# Patient Record
Sex: Male | Born: 1960 | Race: White | Hispanic: No | Marital: Married | Smoking: Former smoker
Health system: Southern US, Community
[De-identification: ages and names within clinical notes are randomized; demographics above are authoritative.]

## PROBLEM LIST (undated history)

## (undated) DIAGNOSIS — Z98811 Dental restoration status: Secondary | ICD-10-CM

## (undated) DIAGNOSIS — F329 Major depressive disorder, single episode, unspecified: Secondary | ICD-10-CM

## (undated) DIAGNOSIS — R112 Nausea with vomiting, unspecified: Secondary | ICD-10-CM

## (undated) DIAGNOSIS — M87052 Idiopathic aseptic necrosis of left femur: Secondary | ICD-10-CM

## (undated) DIAGNOSIS — Z972 Presence of dental prosthetic device (complete) (partial): Secondary | ICD-10-CM

## (undated) DIAGNOSIS — K589 Irritable bowel syndrome without diarrhea: Secondary | ICD-10-CM

## (undated) DIAGNOSIS — G4700 Insomnia, unspecified: Secondary | ICD-10-CM

## (undated) DIAGNOSIS — F32A Depression, unspecified: Secondary | ICD-10-CM

## (undated) DIAGNOSIS — M1990 Unspecified osteoarthritis, unspecified site: Secondary | ICD-10-CM

## (undated) DIAGNOSIS — R911 Solitary pulmonary nodule: Secondary | ICD-10-CM

## (undated) DIAGNOSIS — K449 Diaphragmatic hernia without obstruction or gangrene: Secondary | ICD-10-CM

## (undated) DIAGNOSIS — I742 Embolism and thrombosis of arteries of the upper extremities: Secondary | ICD-10-CM

## (undated) DIAGNOSIS — S0990XA Unspecified injury of head, initial encounter: Secondary | ICD-10-CM

## (undated) DIAGNOSIS — G5621 Lesion of ulnar nerve, right upper limb: Secondary | ICD-10-CM

## (undated) DIAGNOSIS — K219 Gastro-esophageal reflux disease without esophagitis: Secondary | ICD-10-CM

## (undated) DIAGNOSIS — F419 Anxiety disorder, unspecified: Secondary | ICD-10-CM

## (undated) DIAGNOSIS — L723 Sebaceous cyst: Secondary | ICD-10-CM

## (undated) DIAGNOSIS — N281 Cyst of kidney, acquired: Secondary | ICD-10-CM

## (undated) DIAGNOSIS — S60511A Abrasion of right hand, initial encounter: Secondary | ICD-10-CM

## (undated) DIAGNOSIS — I2510 Atherosclerotic heart disease of native coronary artery without angina pectoris: Secondary | ICD-10-CM

## (undated) DIAGNOSIS — Z8489 Family history of other specified conditions: Secondary | ICD-10-CM

## (undated) DIAGNOSIS — Z973 Presence of spectacles and contact lenses: Secondary | ICD-10-CM

## (undated) DIAGNOSIS — D126 Benign neoplasm of colon, unspecified: Secondary | ICD-10-CM

## (undated) DIAGNOSIS — Z9889 Other specified postprocedural states: Secondary | ICD-10-CM

## (undated) DIAGNOSIS — E785 Hyperlipidemia, unspecified: Secondary | ICD-10-CM

## (undated) DIAGNOSIS — M459 Ankylosing spondylitis of unspecified sites in spine: Secondary | ICD-10-CM

## (undated) HISTORY — DX: Depression, unspecified: F32.A

## (undated) HISTORY — DX: Insomnia, unspecified: G47.00

## (undated) HISTORY — DX: Irritable bowel syndrome without diarrhea: K58.9

## (undated) HISTORY — DX: Diaphragmatic hernia without obstruction or gangrene: K44.9

## (undated) HISTORY — PX: UMBILICAL HERNIA REPAIR: SHX196

## (undated) HISTORY — DX: Atherosclerotic heart disease of native coronary artery without angina pectoris: I25.10

## (undated) HISTORY — DX: Benign neoplasm of colon, unspecified: D12.6

## (undated) HISTORY — DX: Solitary pulmonary nodule: R91.1

## (undated) HISTORY — PX: TONSILLECTOMY: SUR1361

## (undated) HISTORY — DX: Hyperlipidemia, unspecified: E78.5

## (undated) HISTORY — DX: Unspecified injury of head, initial encounter: S09.90XA

---
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
## 1999-08-10 ENCOUNTER — Emergency Department (HOSPITAL_COMMUNITY): Admission: EM | Admit: 1999-08-10 | Discharge: 1999-08-10 | Payer: Self-pay | Admitting: Emergency Medicine

## 2002-10-11 ENCOUNTER — Emergency Department (HOSPITAL_COMMUNITY): Admission: EM | Admit: 2002-10-11 | Discharge: 2002-10-11 | Payer: Self-pay | Admitting: Emergency Medicine

## 2002-10-11 ENCOUNTER — Encounter: Payer: Self-pay | Admitting: Emergency Medicine

## 2004-06-15 ENCOUNTER — Emergency Department (HOSPITAL_COMMUNITY): Admission: EM | Admit: 2004-06-15 | Discharge: 2004-06-15 | Payer: Self-pay | Admitting: Emergency Medicine

## 2004-06-15 IMAGING — CR DG CERVICAL SPINE COMPLETE 4+V
5 series · 5 of 5 positions shown · non-contrast
Comparison: none

CLINICAL DATA: Hit on top of head with heavy chain.  
 CERVICAL SPINE COMPLETE ? 5 VIEW ? [DATE] 
 There is no evidence of fracture or prevertebral soft tissue swelling.  Alignment is normal.  The intervertebral disk spaces are within normal limits.  No other significant bone abnormalities are identified.
 IMPRESSION
 Negative cervical spine radiographs.

[view not recorded (1 of 5)]
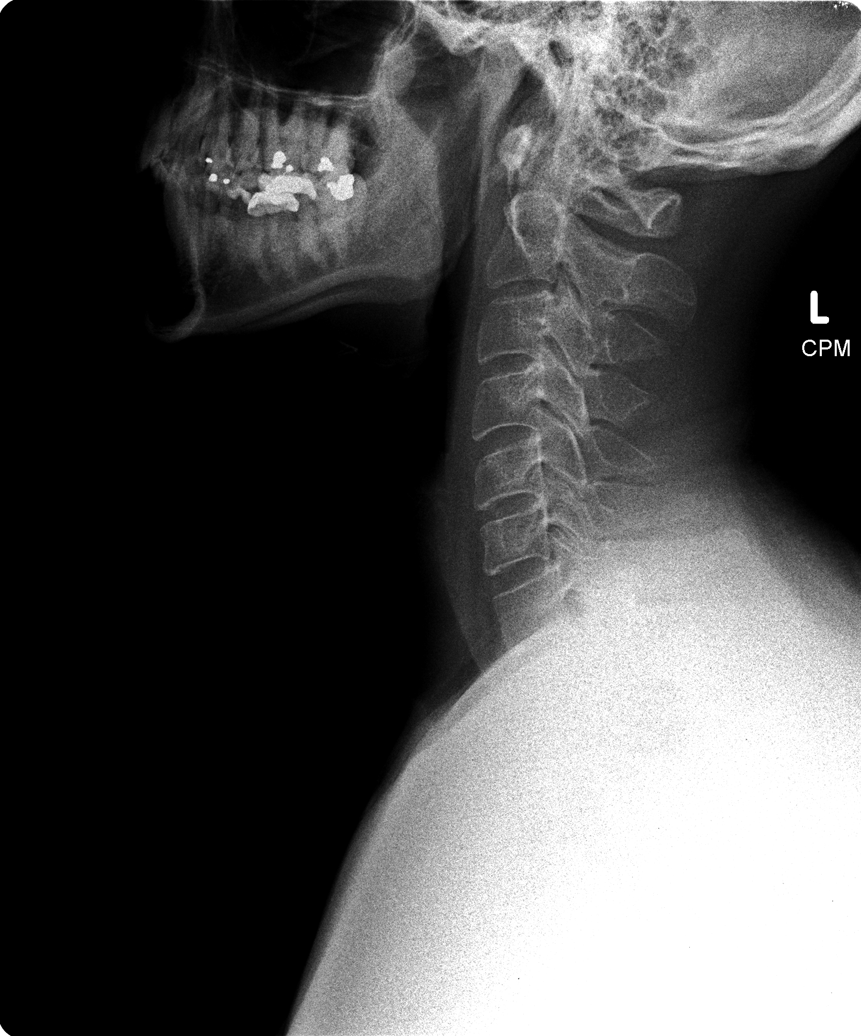

[view not recorded (2 of 5)]
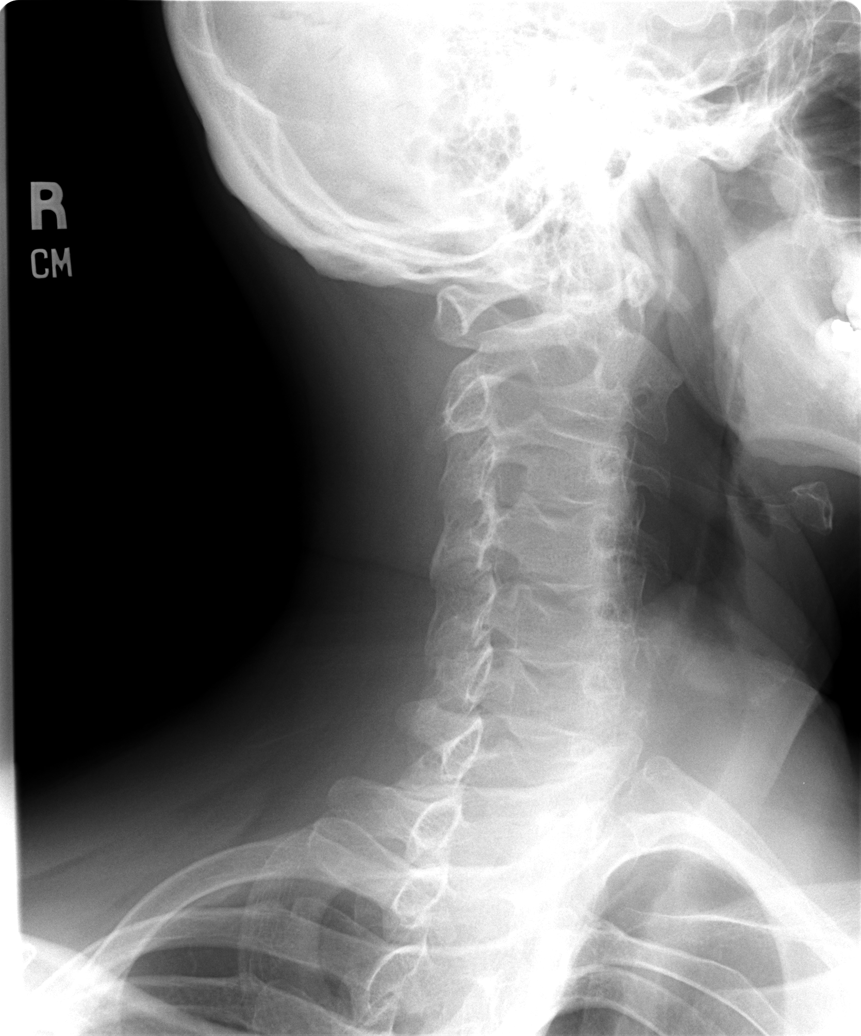

[view not recorded (3 of 5)]
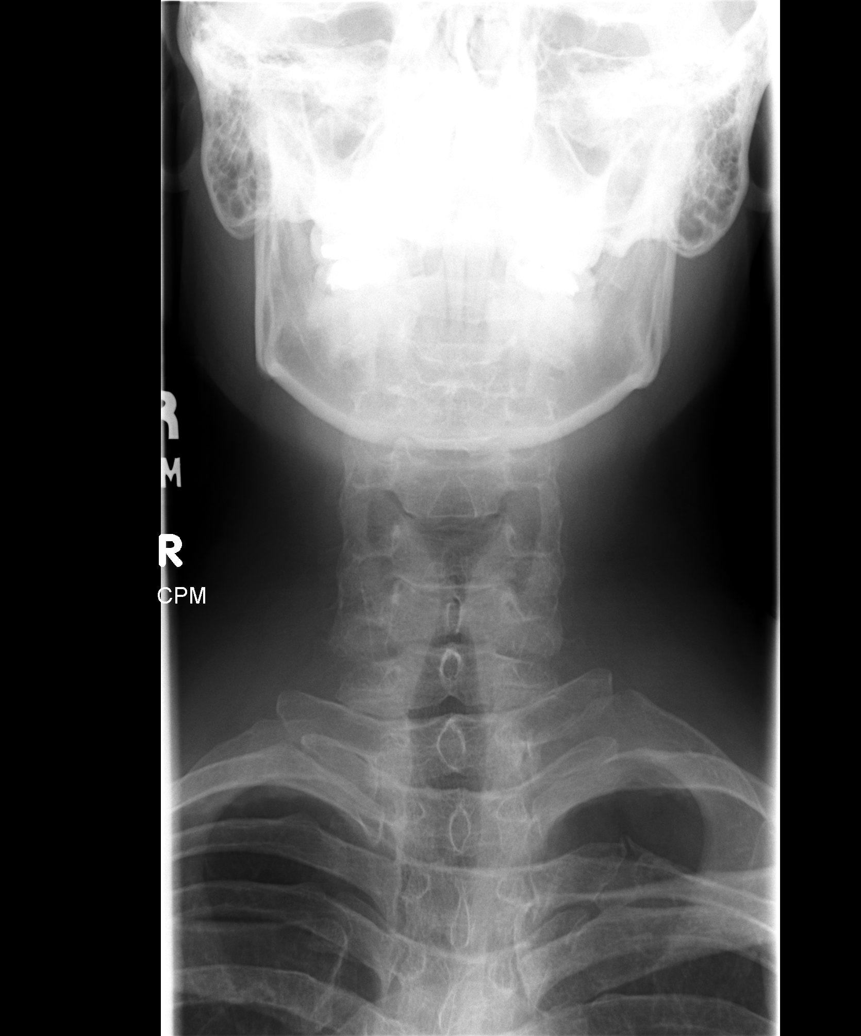

[view not recorded (4 of 5)]
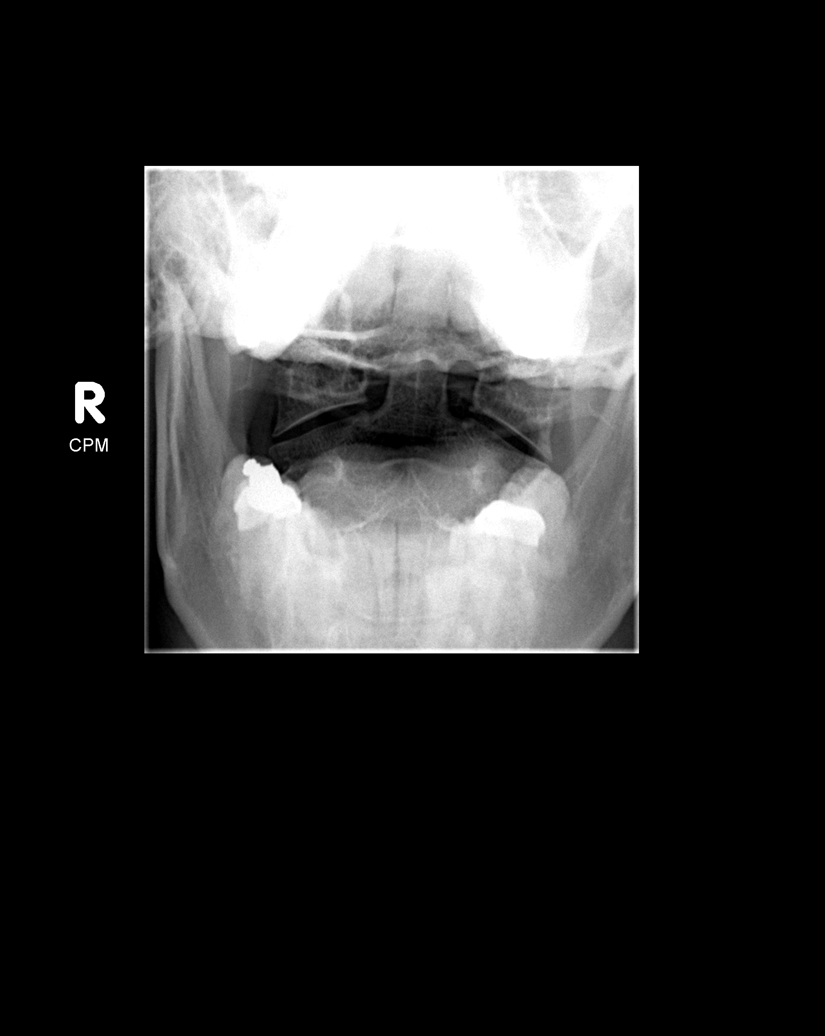

[view not recorded (5 of 5)]
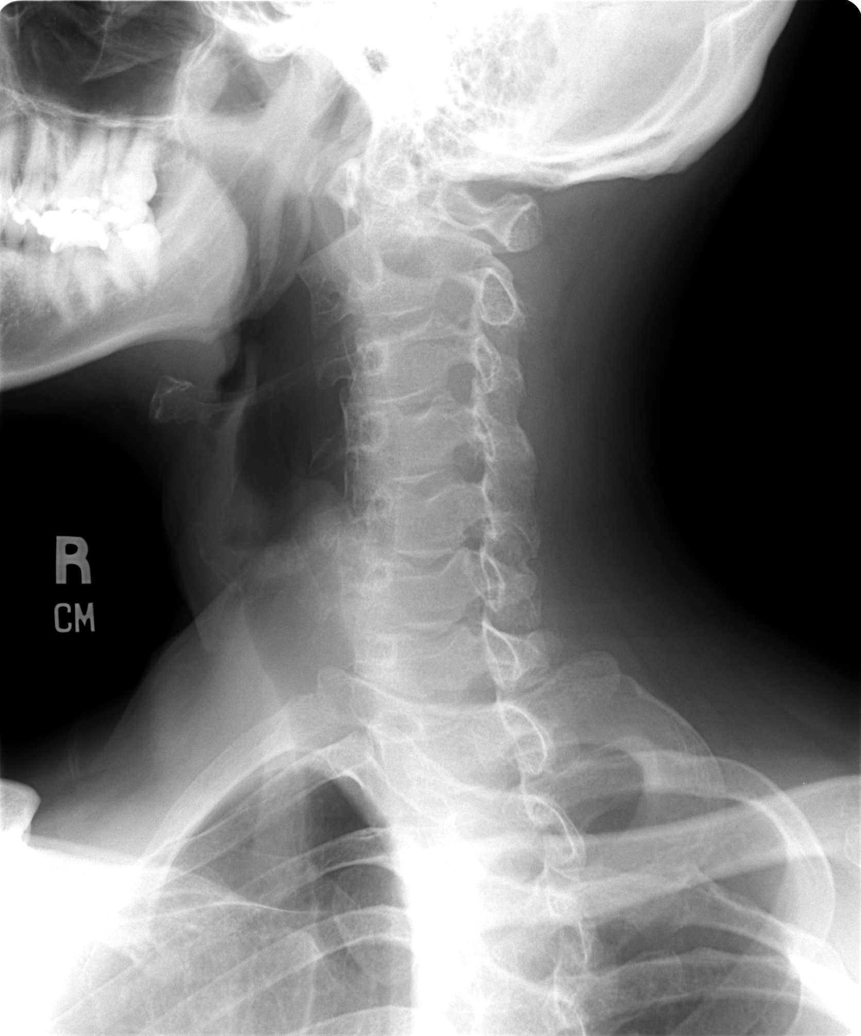

[5 of 5 positions shown; findings below may reference images not displayed]

## 2004-06-15 IMAGING — CT CT HEAD W/O CM
2 of 3 series · 14 of 33 positions shown, 18 images · non-contrast
Comparison: none

CLINICAL DATA: Chain fell on head with laceration on top of head.  
 CT HEAD WITHOUT CONTRAST
 Routine non-contrast head CT was performed. 

 There is no evidence of intracranial hemorrhage, brain edema, or mass effect. The ventricles are normal. No extra-axial abnormalities are identified. Bone windows show no significant abnormalities.
 IMPRESSION
 Negative non-contrast head CT.

[Series 2: — · sagittal · 0.67mm/px · 1 of 3 slices shown (1 of 2)]
[im 2/3  brain]
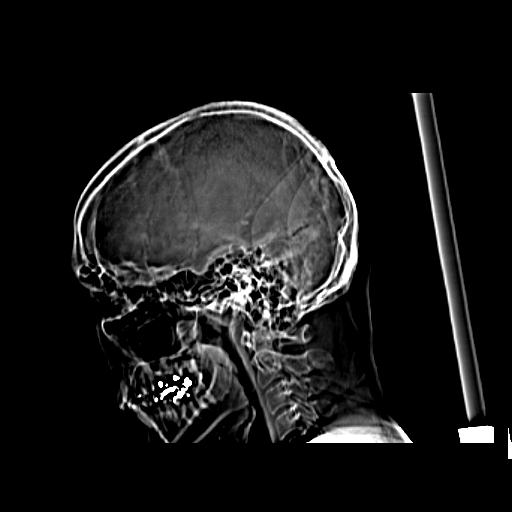

[Series 3: — · axial · 0.43mm/px · z∈[+963,+1083]mm · 13 of 30 slices shown, 17 images (2 of 2)]
[im 3/30  brain]
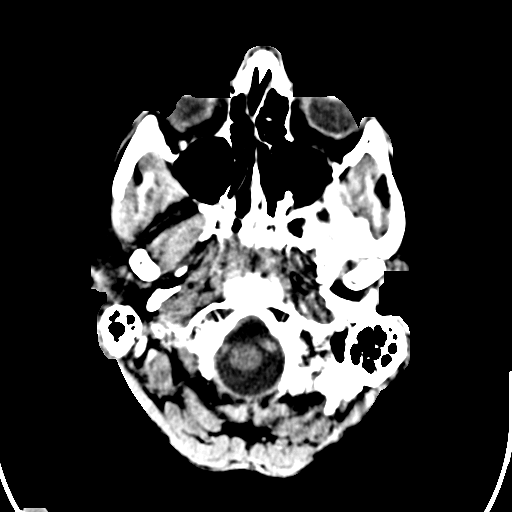
[im 3/30  bone]
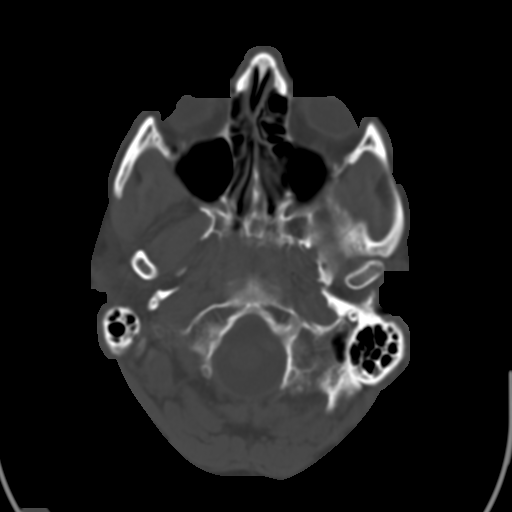
[im 5/30  brain]
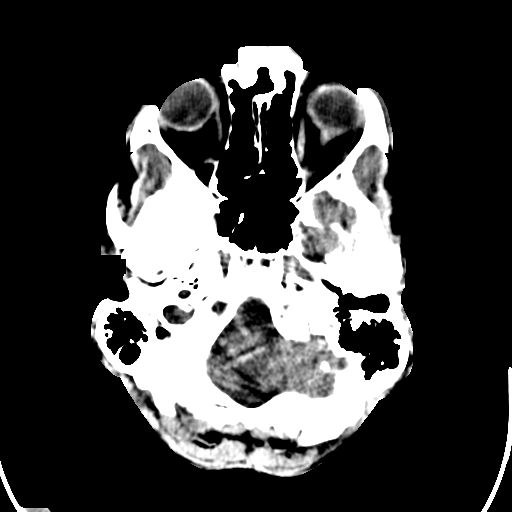
[im 7/30  brain]
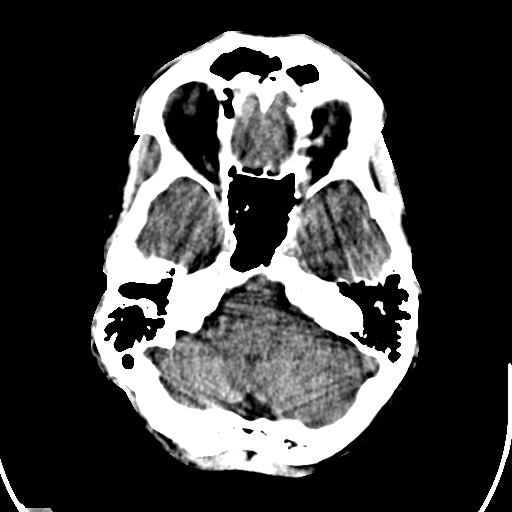
[im 9/30  brain]
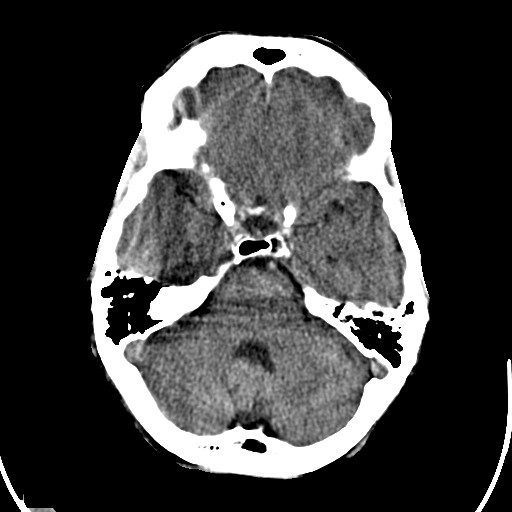
[im 11/30  brain]
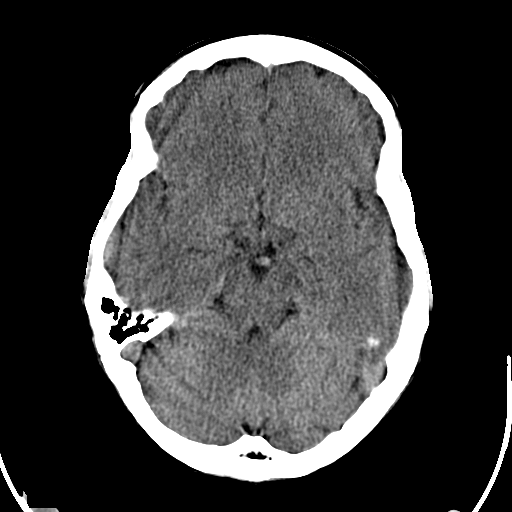
[im 11/30  bone]
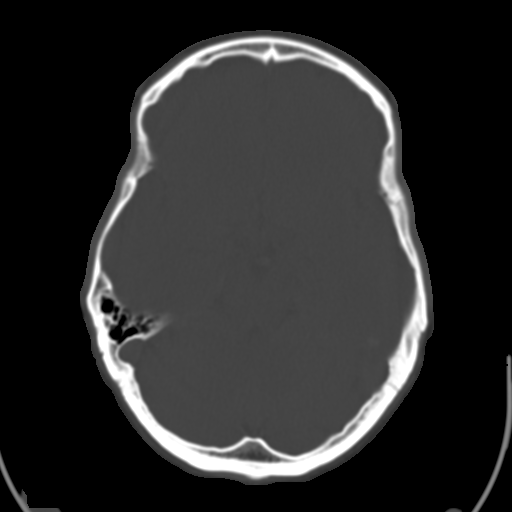
[im 13/30  brain]
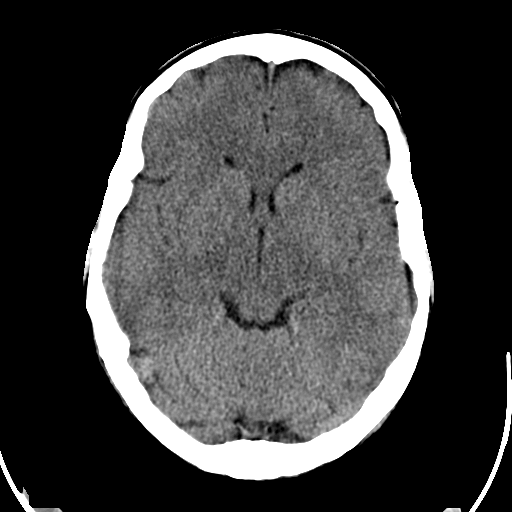
[im 15/30  brain]
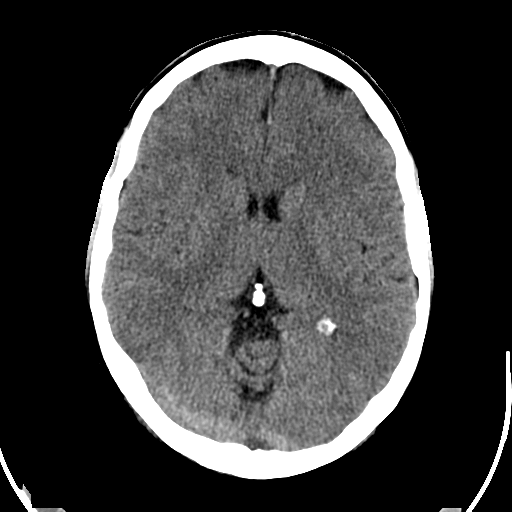
[im 17/30  brain]
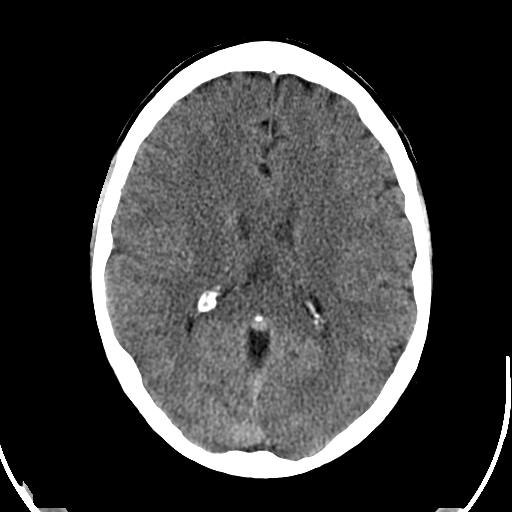
[im 19/30  brain]
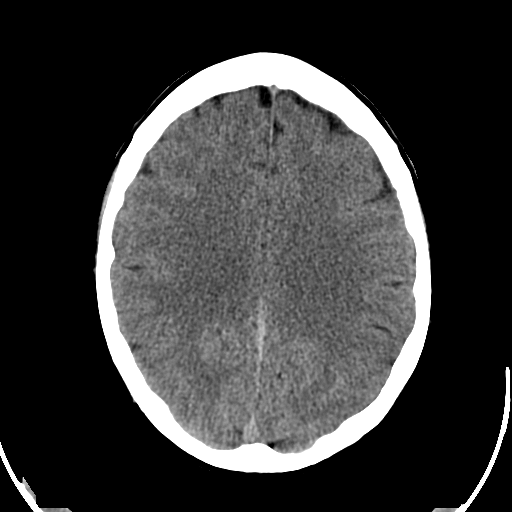
[im 19/30  bone]
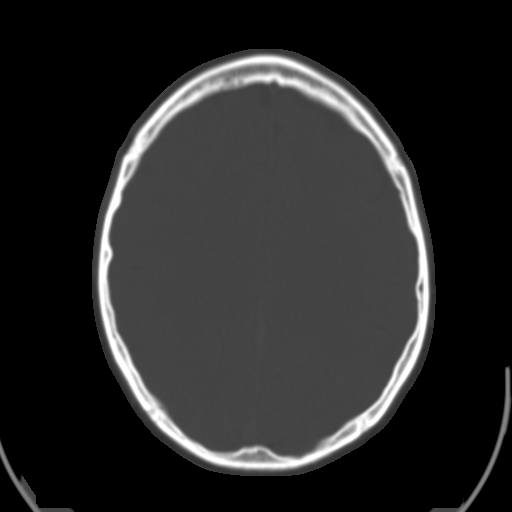
[im 21/30  brain]
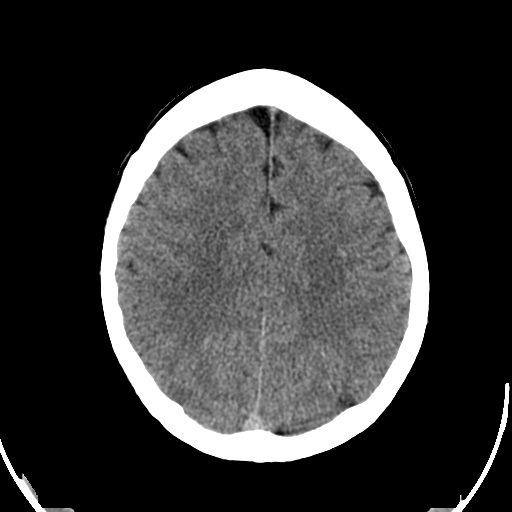
[im 23/30  brain]
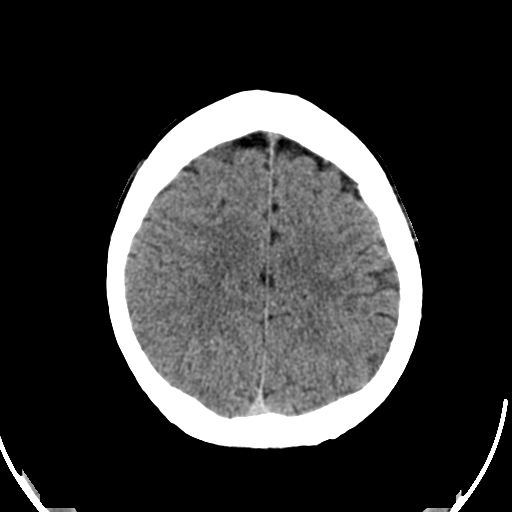
[im 25/30  brain]
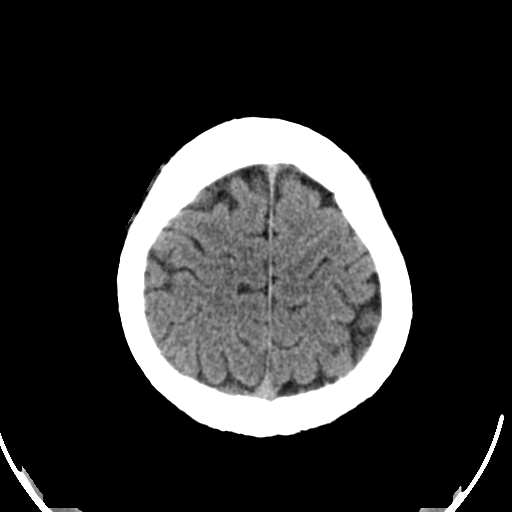
[im 27/30  brain]
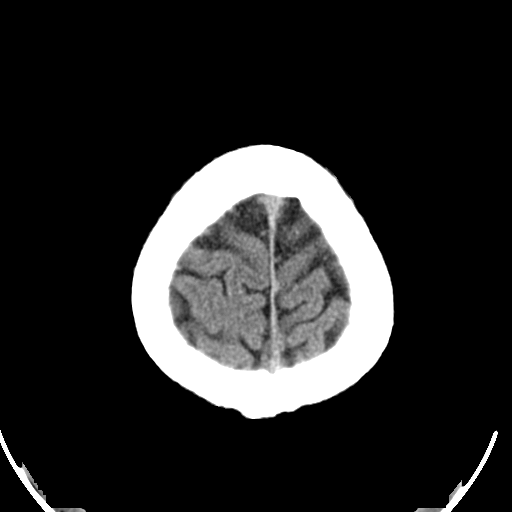
[im 27/30  bone]
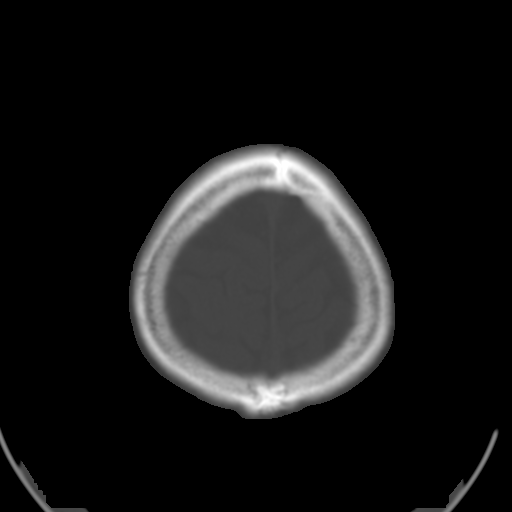

[14 of 33 positions shown; findings below may reference images not displayed]

## 2004-09-15 ENCOUNTER — Ambulatory Visit: Payer: Self-pay | Admitting: Gastroenterology

## 2004-10-09 ENCOUNTER — Ambulatory Visit: Payer: Self-pay | Admitting: Gastroenterology

## 2006-01-18 ENCOUNTER — Ambulatory Visit: Payer: Self-pay | Admitting: Gastroenterology

## 2007-12-28 ENCOUNTER — Ambulatory Visit (HOSPITAL_BASED_OUTPATIENT_CLINIC_OR_DEPARTMENT_OTHER): Admission: RE | Admit: 2007-12-28 | Discharge: 2007-12-28 | Payer: Self-pay | Admitting: Orthopedic Surgery

## 2007-12-28 HISTORY — PX: WRIST ARTHROSCOPY WITH ULNA SHORTENING: SHX5681

## 2010-11-01 DIAGNOSIS — S0990XA Unspecified injury of head, initial encounter: Secondary | ICD-10-CM

## 2010-11-01 HISTORY — DX: Unspecified injury of head, initial encounter: S09.90XA

## 2011-03-16 NOTE — Op Note (Signed)
NAME:  Daniel [name], Daniel [name] NO.:  [id number]   MEDICAL RECORD NO.:  [id number]          PATIENT TYPE:  AMB   LOCATION:  [facility]SC                          FACILITY:  [facility]   PHYSICIAN:  Katy Fitch. Sypher, M.D. 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   OPERATION:  1. 3 mm ulnar shortening osteoplasty with application of a ASIF      compression plate with lag screw across the ulnar shortening      osteoplasty.  2. Arthroscopic evaluation of right wrist followed by arthroscopic      debridement of bridging scar between dorsal capsule and torn      lunatotriquetral interosseous ligament and debridement of      degenerative triangular fibrocartilage complex tear and lunate      chondromalacia.   OPERATING SURGEON:  Josephine Igo, M.D.   ASSISTANT:  Annye Rusk, PA-C.   ANESTHESIA:  General by LMA.  Supervising anesthesiologist is Dr. Diamantina Monks.   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  He is  referred by his primary  care physician, Creola Corn, for evaluation and  management of a painful right wrist.  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  Clinical examination  revealed a prominent right distal ulna and clinical signs of ulnocarpal  abutment.  Plain x-rays documented 2 mm ulnar variance and an MRI of the  wrist documented lunate chondromalacia, edema in the marrow and LT  ligament injury and signs of chronic ulnocarpal abutment.   We advised Daniel [name] to strongly consider proceeding with an ulnar  shortening followed by arthroscopic debridement of the wrist.   Preoperatively he was advised that we could not replace any his  cartilage surfaces, however, with decompression of the ulnocarpal joint,  typically individuals will have significant relief of their pain.  He  understands with an ulnar shortening there is always a small chance of  nonunion and/or malunion.  There is a chance that he will require a  secondary procedure if we have healing problems of the ulna.   With compression plate systems and lag screw fixation in general, we  have had success with healing in 8-12 weeks.  Preoperatively, questions were invited and answered in detail.   PROCEDURE:  Daniel [name] is brought to the operating room and placed  in supine position on the operating table.   Following an anesthesia consult with Dr. Katrinka Blazing, general anesthesia by  LMA technique was recommended and accepted.   Daniel [name] was given 1 gram of Ancef as IV prophylactic antibiotic.  He is brought to room 8 and  placed in supine position on the operating  table and under Dr. Michaelle Copas direct supervision, general anesthesia by  LMA technique induced.   His right arm was prepped with Betadine soap solution, sterilely draped.  A pneumatic tourniquet was applied to the proximal right brachium.   Following exsanguination of the right arm with Esmarch bandage, arterial  tourniquet inflated to  220 mmHg.  Procedure commenced with a 12 cm  incision directly over the dorsal ulna.  Subcutaneous tissues were  carefully divided taking care to release the fascia overlying the  extensor carpi ulnaris and elevating the extensor indicis proprius.   A proper position on the ulna was selected and a six-hole plate  contoured to fit the ulna.   The plate was placed with two distal cortical screws followed by  creation of a 50 degrees oblique ulnar shortening osteoplasty precisely  measuring 3 mm of removed bone.   The osteoplasty was created with two parallel cuts with a oscillating  saw followed by resection of the bone fragment and curettage of the  margins to create better surfaces for healing of the cortical bone.   The plate was applied with standard ASIF technique utilizing a  compression clamp.   The shortening of the ulna was checked with C-arm fluoroscope and found  to result in an ulnar neutral position.   The osteoplasty was completed by placement of a 20-mm compression lag  screw across the osteotomy at a 90 degrees angle and placing the  remaining screws in the plate proximally and distally.   Final images were obtained with a C-arm fluoroscope documenting very  satisfactory construct.   The wound was then irrigated followed by placement of bone graft around  the osteoplasty site from the drill holes.  The wound was repaired with  subdermal suture of 4-0 Vicryl and intradermal 3-0 Prolene with Steri-  Strips.   The wrist was then distracted with 10 pounds traction in a tower  designed for wrist arthroscopy.  Counter traction was applied to the  proximal forearm.   The scope was gently placed through the 3-4 dorsal portal and diagnostic  arthroscopy revealed intact hyaline intra-articular cartilage surfaces  on the scaphoid and radial aspect of the lunate.  The ulnar aspect of  the lunate was obliterated with scar forming from the dorsal capsule.  A  4-5 dorsal  portal was created with a 18 gauge needle followed by use of  a blunt probe to gently release scar.  The suction shaver was placed and  the scar between the ulnar aspect of lunate and LT ligament was debrided  with suction shaver.  Care was taken to protect the triangular  fibrocartilage.   After the scar had been removed, we could visualize ulnocarpal joint.  The trial fibrocartilage had a central degenerative tear.  The scope was  then removed from 3-4 dorsal portal placed in the 4-5 portal.  The  triangular fibrocartilage tear was documented with a digital camera as  was the LT ligament which had a grade 3 tear.  After thorough  debridement,  the scope was removed.  There no apparent complications.   The portals were repaired with Steri-Strips.   Daniel [name] was placed in compressive dressing with a long-arm splint  maintaining the forearm in neutral position and the wrist in neutral  position.  There no apparent complications.   He is awakened general anesthesia and transferred over stable signs.      Katy Fitch Sypher, M.D.  Electronically Signed     RVS/MEDQ  D:  12/28/2007  T:  12/29/2007  Job:  [id number]   cc:   Gwen Pounds, MD

## 2011-06-30 ENCOUNTER — Encounter (HOSPITAL_BASED_OUTPATIENT_CLINIC_OR_DEPARTMENT_OTHER)
Admission: RE | Admit: 2011-06-30 | Discharge: 2011-06-30 | Disposition: A | Discharge status: Home / Self Care | Payer: 59 | Source: Ambulatory Visit | Attending: Orthopedic Surgery | Admitting: Orthopedic Surgery

## 2011-07-01 ENCOUNTER — Ambulatory Visit (HOSPITAL_BASED_OUTPATIENT_CLINIC_OR_DEPARTMENT_OTHER)
Admission: RE | Admit: 2011-07-01 | Discharge: 2011-07-01 | Disposition: A | Discharge status: Home / Self Care | Payer: 59 | Source: Ambulatory Visit | Attending: Orthopedic Surgery | Admitting: Orthopedic Surgery

## 2011-07-01 DIAGNOSIS — M25519 Pain in unspecified shoulder: Secondary | ICD-10-CM | POA: Insufficient documentation

## 2011-07-01 DIAGNOSIS — M6688 Spontaneous rupture of other tendons, other: Secondary | ICD-10-CM | POA: Insufficient documentation

## 2011-07-01 DIAGNOSIS — Z01810 Encounter for preprocedural cardiovascular examination: Secondary | ICD-10-CM | POA: Insufficient documentation

## 2011-07-01 DIAGNOSIS — Z01812 Encounter for preprocedural laboratory examination: Secondary | ICD-10-CM | POA: Insufficient documentation

## 2011-07-01 HISTORY — PX: ELBOW ARTHROSCOPY WITH TENDON RECONSTRUCTION: SHX5616

## 2011-07-01 LAB — POCT HEMOGLOBIN-HEMACUE: Hemoglobin: 15.8 g/dL (ref 13.0–17.0)

## 2011-07-06 NOTE — Op Note (Signed)
NAME:  Daniel [name], Daniel [name] NO.:  [id number]  MEDICAL RECORD NO.:  [id number]  LOCATION:                                 FACILITY:  PHYSICIAN:  Katy Fitch. Janmarie Smoot, M.D.      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  OPERATION:  Reconstruction extensor carpi radialis longus and brevis tendons origins to left lateral condyle with decortication and drilling of epicondyle prior to repair.  OPERATING SURGEON:  Katy Fitch. Ruthann Angulo, MD  ASSISTANT:  Marveen Reeks Dasnoit, PA-C  ANESTHESIA:  General by LMA.  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  He sustained a number of injuries on the job and at home over the years and has strained his elbow in home maintenance while back.  He has had left lateral elbow pain for more than 6 months.  We have tried all the nonoperative measures without success.  After more than 4 months of pain, we sent him for an MRI of his elbow which demonstrated in June 2012 cavitary tendinopathy with rupture of the extensor carpi radialis brevis origin from the left lateral humeral epicondyle.  We advised Daniel [name] when he was able to take time off from work to proceed with reconstruction.  He is familiar with the method of tendon origin reconstruction.  Questions were invited and answered in detail.  Preoperatively, he was  interviewed by Dr. Sampson Goon who recommended proceeding with anesthesia by general/LMA technique after informed consent was accepted by Daniel [name].  PROCEDURE:  Daniel [name] was brought to room 2 at the [facility] and placed in supine position upon the operating table. Following the induction of general anesthesia by LMA technique under Dr. Jarrett Ables direct supervision, his left arm was prepped with Betadine soap and solution and sterilely draped.  A pneumatic tourniquet was applied to the proximal left brachium.  Following a routine surgical time-out, left arm was exsanguinated with Esmarch bandage and the arterial tourniquet was inflated to 220 mmHg.  The incision was placed posterior to the epicondyle and along the extensor carpi radialis longus and anconeus margin.  Subcutaneous tissues were carefully divided taking care to identify the fascia and release as a separate layer.  There were neovascularization vessels directly over the extensor carpi radialis longus.  These were electrocauterized followed by incision and elevation of the extensor carpi radialis longus off the epicondyle.  The brevis had a dime sized area of necrotic tendon that had released from the epicondyle.  There was a reactive new bone formation with a substantial osteophyte.  The osteophyte was removed with rongeur dissection followed by drilling of the epicondyle with a 0.035-inch Kirschner wire,  approximately 25 small holes to the intramedullary canal to improve vascularity on the surface.  After irrigation, the extensor carpi radialis longus and brevis were gathered with mattress sutures of 3-0 FiberWire passed through bone tunnels and repaired anatomically to the decorticated and drilled epicondyle.  The tendon margin was then inset with a number of mattress sutures of 3- 0 FiberWire and 3-0 Vicryl.  The portion of the anconeus muscle was transferred anteriorly to improve vascularity of  the tendon.  After irrigation, the fascia was repaired as a separate layer with a series of figure-of-eight sutures of 3-0 Vicryl with knots buried, followed by repair of the skin with subcutaneous 3-0 Vicryl and intradermal 3-0 Prolene.  Daniel [name] was placed in a volar wrist splint maintaining the wrist in 40 degrees of dorsiflexion.  His elbow was dressed with sterile gauze and Tegaderm.  Lidocaine 2% was infiltrated for postoperative analgesia. Daniel [name] was provided 1 g of Ancef as an IV prophylactic antibiotic preoperatively without consequence.  He was awakened from general anesthesia and transferred to the recovery room with stable vital signs.  We anticipate discharge with prescriptions for Percocet 5 mg one p.o. q.4-6 hours p.r.n. pain 30 tablets without refill, also Keflex 500 mg one p.o. q.12 hours x4 days as a prophylactic antibiotic.  He is encouraged to use naproxen sodium as an adjunctive analgesic at home.     Katy Fitch Orvell Careaga, M.D.     RVS/MEDQ  D:  07/01/2011  T:  07/01/2011  Job:  [id number]  Electronically Signed by Daniel [name] M.D. on 07/06/2011 09:54:35 AM

## 2011-07-23 LAB — POCT HEMOGLOBIN-HEMACUE: Hemoglobin: 15.6

## 2012-09-02 ENCOUNTER — Encounter (HOSPITAL_BASED_OUTPATIENT_CLINIC_OR_DEPARTMENT_OTHER): Payer: Self-pay | Admitting: *Deleted

## 2012-09-02 ENCOUNTER — Emergency Department (HOSPITAL_BASED_OUTPATIENT_CLINIC_OR_DEPARTMENT_OTHER)
Admission: EM | Admit: 2012-09-02 | Discharge: 2012-09-02 | Disposition: A | Discharge status: Home / Self Care | Payer: 59 | Attending: Emergency Medicine | Admitting: Emergency Medicine

## 2012-09-02 DIAGNOSIS — Z79899 Other long term (current) drug therapy: Secondary | ICD-10-CM | POA: Insufficient documentation

## 2012-09-02 DIAGNOSIS — T1500XA Foreign body in cornea, unspecified eye, initial encounter: Secondary | ICD-10-CM

## 2012-09-02 DIAGNOSIS — Y990 Civilian activity done for income or pay: Secondary | ICD-10-CM | POA: Insufficient documentation

## 2012-09-02 DIAGNOSIS — Y9269 Other specified industrial and construction area as the place of occurrence of the external cause: Secondary | ICD-10-CM | POA: Insufficient documentation

## 2012-09-02 DIAGNOSIS — T1590XA Foreign body on external eye, part unspecified, unspecified eye, initial encounter: Secondary | ICD-10-CM | POA: Insufficient documentation

## 2012-09-02 DIAGNOSIS — Y9389 Activity, other specified: Secondary | ICD-10-CM | POA: Insufficient documentation

## 2012-09-02 MED ORDER — PROPARACAINE HCL 0.5 % OP SOLN
OPHTHALMIC | Status: AC
  Filled 2012-09-02: qty 15

## 2012-09-02 MED ORDER — FLUORESCEIN SODIUM 1 MG OP STRP
1.0000 | ORAL_STRIP | Freq: Once | OPHTHALMIC | Status: DC
  Filled 2012-09-02: qty 1

## 2012-09-02 MED ORDER — BACITRACIN-POLYMYXIN B 500-10000 UNIT/GM OP OINT: TOPICAL_OINTMENT | Freq: Two times a day (BID) | OPHTHALMIC | Status: DC

## 2012-09-02 MED ORDER — PROPARACAINE HCL 0.5 % OP SOLN
2.0000 [drp] | Freq: Once | OPHTHALMIC | Status: AC
  Administered 2012-09-02: 2 [drp] via OPHTHALMIC

## 2012-09-02 MED ORDER — TETRACAINE HCL 0.5 % OP SOLN
2.0000 [drp] | Freq: Once | OPHTHALMIC | Status: AC
  Administered 2012-09-02: 2 [drp] via OPHTHALMIC
  Filled 2012-09-02: qty 2

## 2012-09-02 NOTE — ED Provider Notes (Signed)
History  This chart was scribed for Daniel Skene, MD by Shari Heritage and Marlin Canary. The patient was seen in room MHT14/MHT14. Patient's care was started at 2101.  CSN: [id number]  Arrival date & time 09/02/12  1846   First MD Initiated Contact with Patient 09/02/12 2101      Chief Complaint  Patient presents with  . Foreign Body in Eye     The history is provided by the patient. No language interpreter was used.    Daniel [name] is a 51 y.o. male who presents to the Emergency Department complaining of constant, moderate right eye pain onset 1 day ago. He says he was grinding metal while wearing goggles at work 2 days ago when something got into his eye. Patient states that he began to feel eye irritation the next day and he describes the pain as feeling like his eye was being cut. He denies  HA, SOB, emesis or any other symptoms. Patient states his vision is slightly blurry but says his vision is getting progressively worse due to old age. He reports no other significant past medical history.  Patient's ophthalmologist practices at [facility], but patient is unsure of his name.   History reviewed. No pertinent past medical history.  Past Surgical History  Procedure Date  . Fracture surgery     History reviewed. No pertinent family history.  History  Substance Use Topics  . Smoking status: Never Smoker   . Smokeless tobacco: Not on file  . Alcohol Use: No      Review of Systems At least 10pt or greater review of systems completed and are negative except where specified in the HPI.  Allergies  Vicodin  Home Medications   Current Outpatient Rx  Name Route Sig Dispense Refill  . CITALOPRAM HYDROBROMIDE 40 MG PO TABS Oral Take 40 mg by mouth daily.      BP 141/99  Pulse 67  Temp 98.3 F (36.8 C) (Oral)  Resp 16  SpO2 100%  Physical Exam  Nursing notes reviewed.  Electronic medical record reviewed. VITAL SIGNS:   Filed Vitals:   09/02/12  1905  BP: 141/99  Pulse: 67  Temp: 98.3 F (36.8 C)  TempSrc: Oral  Resp: 16  SpO2: 100%   CONSTITUTIONAL: Awake, oriented, appears non-toxic HENT: Atraumatic, normocephalic, oral mucosa pink and moist, airway patent. Nares patent without drainage. External ears normal. EYES: Ocular Exam General appearance:right eye injected conjunctiva, iris and pupil appear intact, left eye normal Visual acuity OD:20/20, OS:20/20, OU:20/20 No visual field deficits, EOMI, PERRLA 4mm Fundoscopic exam with pan-opthalmoscope showed no papilledema, normal vasculature Lids/Lashes:normal Anterior Chamber: No cell/flare, no hyphema, no hypopyon. No pain to iris contraction. Iris appears normal. Corneal FB 4'o'clock position of the cornea in the right eye.  Appears to be metallic with no rust ring Fluoroscein exam with cobalt blue filter reveals dye uptake at the FB in the 4'o'clock position of the cornea NECK: Trachea midline, non-tender, supple CARDIOVASCULAR: Normal heart rate, Normal rhythm, No murmurs, rubs, gallops PULMONARY/CHEST: Clear to auscultation, no rhonchi, wheezes, or rales. Symmetrical breath sounds. Non-tender. NEUROLOGIC: Non-focal, moving all four extremities, no gross sensory or motor deficits. EXTREMITIES: No clubbing, cyanosis, or edema SKIN: Warm, Dry, No erythema, No rash  ED Course  Procedures (including critical care time)  Removal of RIGHT sided Corneal FB Eye was anesthetized using 2 drops proparacaine.  Using an insulin needle, FB was mostly removed with needle and eye flushed afterward. Pt tolerated the  procedure well.   COORDINATION OF CARE: 9:30pm- Patient informed of current plan for treatment and evaluation and agrees with plan at this time.   Labs Reviewed - No data to display  No results found.   1. Corneal foreign body with residual material     Medications  citalopram (CELEXA) 40 MG tablet (not administered)  bacitracin-polymyxin b (POLYSPORIN) ophthalmic  ointment (not administered)  tetracaine (PONTOCAINE) 0.5 % ophthalmic solution 2 drop (2 drop Right Eye Given 09/02/12 2026)  proparacaine (ALCAINE) 0.5 % ophthalmic solution 2 drop (2 drop Right Eye Given 09/02/12 2130)      MDM  Daniel [name] is a 51 y.o. male presenting with corneal FB, likely metal.  PT encounter began with PA, however, pt became verbally abusive with her and she deferred his care to me. I removed most of FB with insulin needle and slit lamp - patient had considerable difficulty keeping his eye still - based on risks of possible damaging the cornea further - pt agrees to follow up tomorrow with ophthalmology for complete removal of FB and surrounding cornea which will likely develop a rust ring. Vision unaffected at this time - no anterior chamber pathology likely. Pt does not wear contacts. Discussed case with Dr. Maple Hudson - pt to follow up tomorrow at 0800. Antibiotic ointment Rx.  I explained the diagnosis and have given explicit precautions to return to the ER including changes in vision or any other new or worsening symptoms. The patient understands and accepts the medical plan as it's been dictated and I have answered their questions. Discharge instructions concerning home care and prescriptions have been given.  The patient is STABLE and is discharged to home in good condition.    I personally performed the services described in this documentation, which was scribed in my presence. The recorded information has been reviewed and considered. Daniel [name], M.D.     Daniel Skene, MD 09/04/12 1332

## 2012-09-02 NOTE — ED Notes (Signed)
Pt's eye was flushed with approx 300 ml NS. Tolerated poorly. Small ?fleck of metal noted in emesis basin. Dr. Jerilynn Birkenhead advised.

## 2012-09-02 NOTE — ED Notes (Signed)
Pt presents to ED today with piece of metal in right eye.  Pt states was wearing safety glasses but can see and feel peice

## 2014-08-01 DIAGNOSIS — G5621 Lesion of ulnar nerve, right upper limb: Secondary | ICD-10-CM

## 2014-08-01 DIAGNOSIS — I742 Embolism and thrombosis of arteries of the upper extremities: Secondary | ICD-10-CM

## 2014-08-01 HISTORY — DX: Embolism and thrombosis of arteries of the upper extremities: I74.2

## 2014-08-01 HISTORY — DX: Lesion of ulnar nerve, right upper limb: G56.21

## 2014-08-07 ENCOUNTER — Other Ambulatory Visit (HOSPITAL_COMMUNITY): Payer: Self-pay | Admitting: Orthopedic Surgery

## 2014-08-07 DIAGNOSIS — I742 Embolism and thrombosis of arteries of the upper extremities: Secondary | ICD-10-CM

## 2014-08-09 ENCOUNTER — Encounter (HOSPITAL_COMMUNITY): Payer: Self-pay | Admitting: Pharmacy Technician

## 2014-08-12 ENCOUNTER — Other Ambulatory Visit: Payer: Self-pay | Admitting: Radiology

## 2014-08-13 ENCOUNTER — Ambulatory Visit (HOSPITAL_COMMUNITY)
Admission: RE | Admit: 2014-08-13 | Discharge: 2014-08-13 | Disposition: A | Discharge status: Home / Self Care | Payer: 59 | Source: Ambulatory Visit | Attending: Orthopedic Surgery | Admitting: Orthopedic Surgery

## 2014-08-13 ENCOUNTER — Other Ambulatory Visit (HOSPITAL_COMMUNITY): Payer: Self-pay | Admitting: Orthopedic Surgery

## 2014-08-13 ENCOUNTER — Encounter (HOSPITAL_COMMUNITY): Payer: Self-pay

## 2014-08-13 DIAGNOSIS — I742 Embolism and thrombosis of arteries of the upper extremities: Secondary | ICD-10-CM | POA: Diagnosis present

## 2014-08-13 HISTORY — DX: Anxiety disorder, unspecified: F41.9

## 2014-08-13 HISTORY — DX: Gastro-esophageal reflux disease without esophagitis: K21.9

## 2014-08-13 HISTORY — DX: Unspecified osteoarthritis, unspecified site: M19.90

## 2014-08-13 LAB — BASIC METABOLIC PANEL
ANION GAP: 14 (ref 5–15)
BUN: 13 mg/dL (ref 6–23)
CALCIUM: 9.1 mg/dL (ref 8.4–10.5)
CHLORIDE: 101 meq/L (ref 96–112)
CO2: 24 meq/L (ref 19–32)
CREATININE: 0.97 mg/dL (ref 0.50–1.35)
GFR calc Af Amer: >90 mL/min (ref >90)
GFR calc non Af Amer: >90 mL/min (ref >90)
GLUCOSE: 93 mg/dL (ref 70–99)
Potassium: 3.8 mEq/L (ref 3.7–5.3)
Sodium: 139 mEq/L (ref 137–147)

## 2014-08-13 LAB — PROTIME-INR
INR: 0.97 (ref 0.00–1.49)
Prothrombin Time: 13 seconds (ref 11.6–15.2)

## 2014-08-13 LAB — CBC WITH DIFFERENTIAL/PLATELET
BASOS PCT: 0 % (ref 0–1)
Basophils Absolute: 0 10*3/uL (ref 0.0–0.1)
EOS PCT: 1 % (ref 0–5)
Eosinophils Absolute: 0 10*3/uL (ref 0.0–0.7)
HEMATOCRIT: 42.3 % (ref 39.0–52.0)
HEMOGLOBIN: 15.2 g/dL (ref 13.0–17.0)
Lymphocytes Relative: 43 % (ref 12–46)
Lymphs Abs: 2.6 10*3/uL (ref 0.7–4.0)
MCH: 32.1 pg (ref 26.0–34.0)
MCHC: 35.9 g/dL (ref 30.0–36.0)
MCV: 89.4 fL (ref 78.0–100.0)
MONO ABS: 0.5 10*3/uL (ref 0.1–1.0)
MONOS PCT: 9 % (ref 3–12)
NEUTROS ABS: 2.8 10*3/uL (ref 1.7–7.7)
Neutrophils Relative %: 47 % (ref 43–77)
Platelets: 220 10*3/uL (ref 150–400)
RBC: 4.73 MIL/uL (ref 4.22–5.81)
RDW: 13.1 % (ref 11.5–15.5)
WBC: 6 10*3/uL (ref 4.0–10.5)

## 2014-08-13 IMAGING — XA IR US GUIDE VASC ACCESS RIGHT
7 series · 12 of 24 positions shown · IV contrast (IODINE)
Comparison: none

CLINICAL DATA: Ischemia of the right third, fourth and fifth digits
with evidence of ulnar arterial occlusion by clinical exam. Further
characterization is performed with upper extremity arteriography.

[Series 1: care body 4 · 1 of 17 frames shown (1 of 6)]
[frame 11/17]
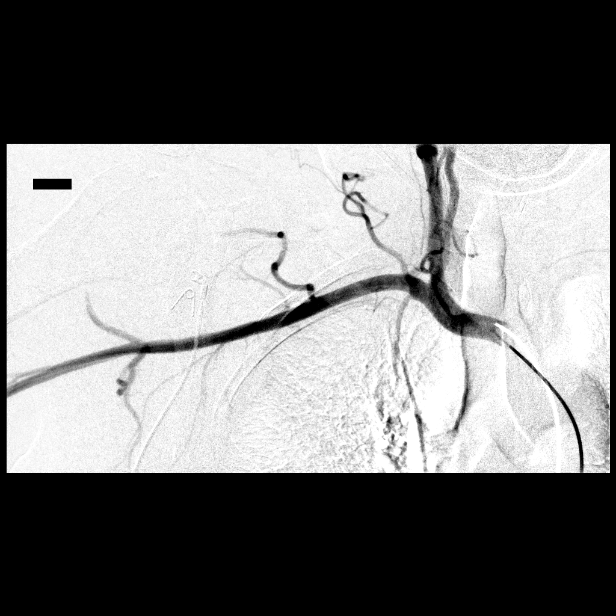

[Series 2: care body 4 · 1 of 26 frames shown (2 of 6)]
[frame 14/26]
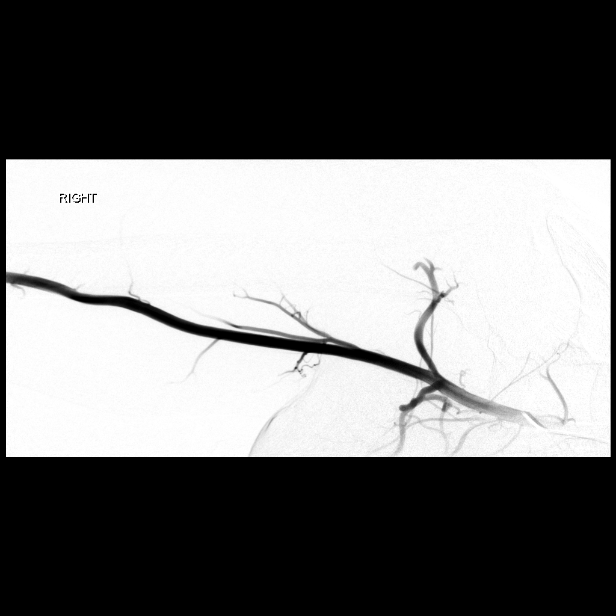

[Series 3: care body 4 · 1 of 30 frames shown (3 of 6)]
[frame 17/30]
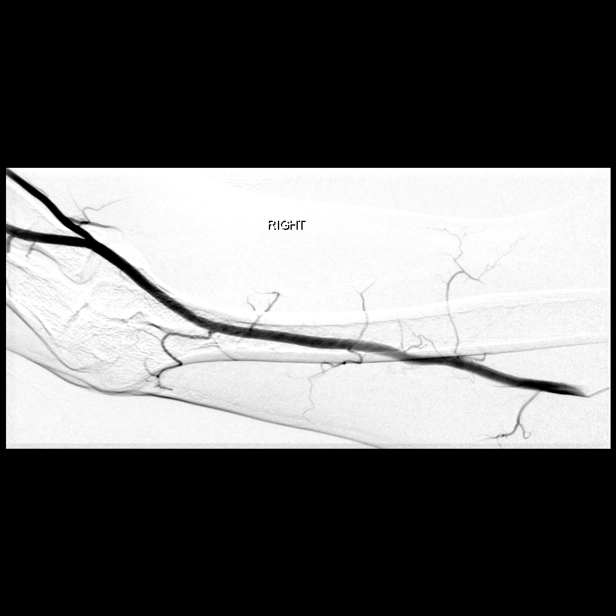

[Series 4: care body 4 · 1 of 47 frames shown (4 of 6)]
[frame 24/47]
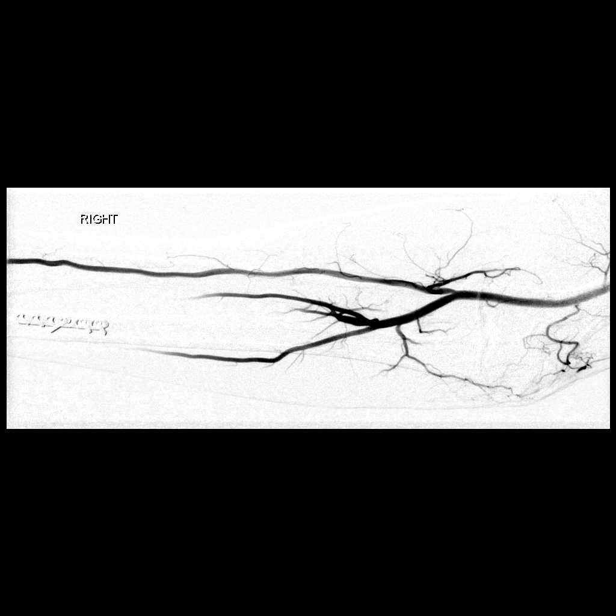

[Series 5: care body 4 · 1 of 59 frames shown (5 of 6)]
[frame 30/59]
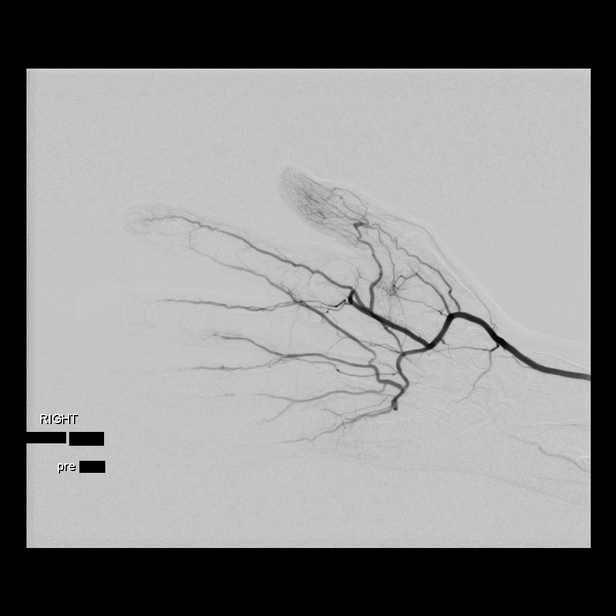

[Series 6: care body 4 · 1 of 47 frames shown (6 of 6)]
[frame 8/47]
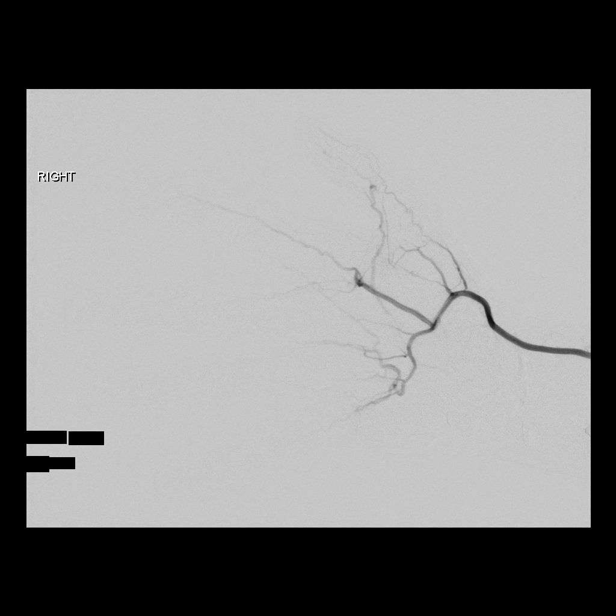

[Series 300: ir embo arterial not (person_name) inc g · arterial · 6 of 19 slices shown]
[im 1/19]
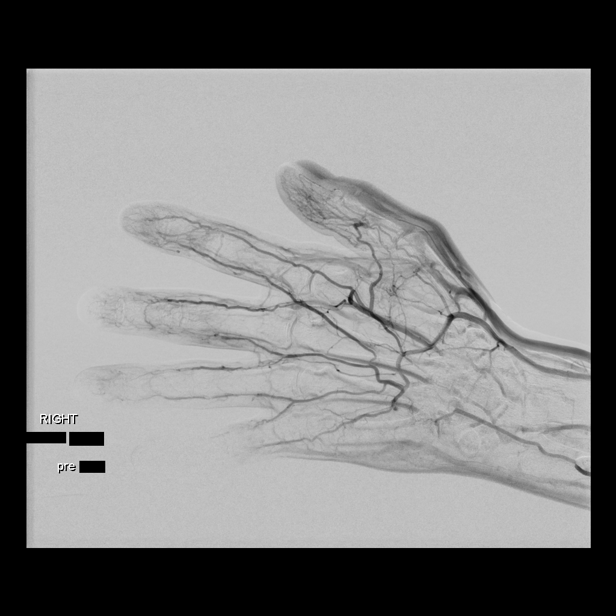
[im 4/19]
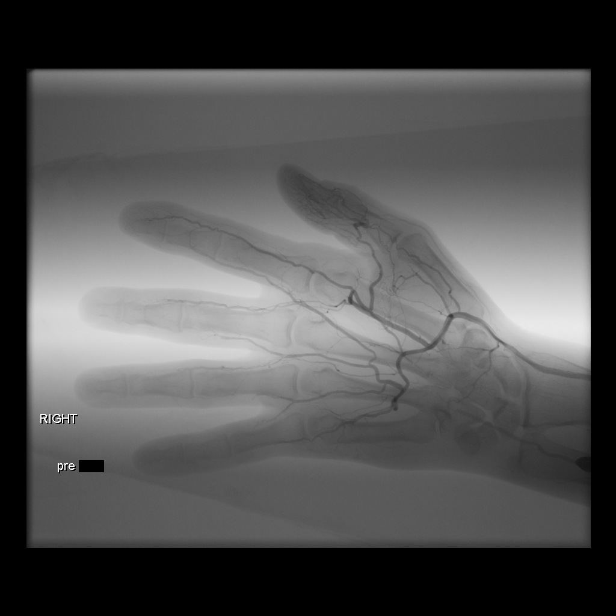
[im 8/19]
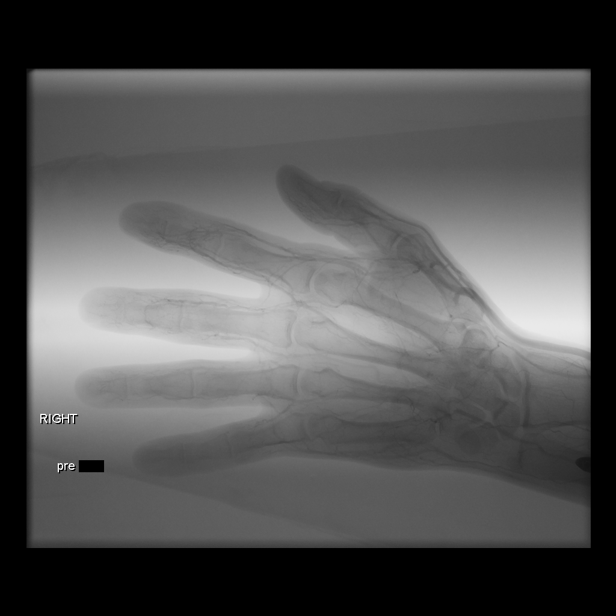
[im 11/19]
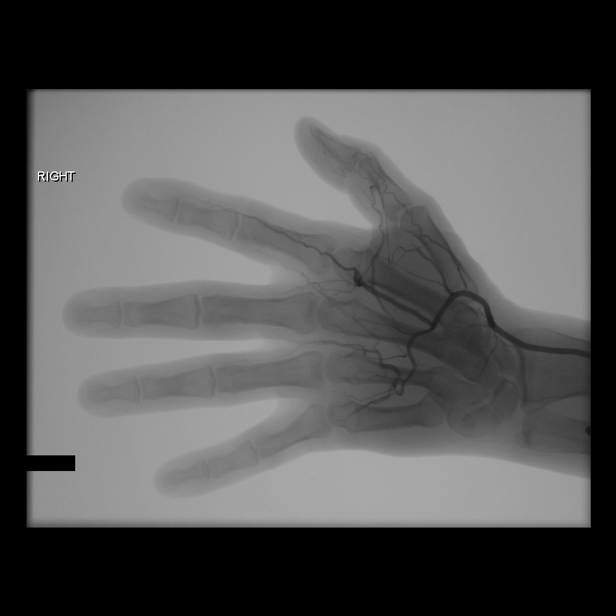
[im 15/19]
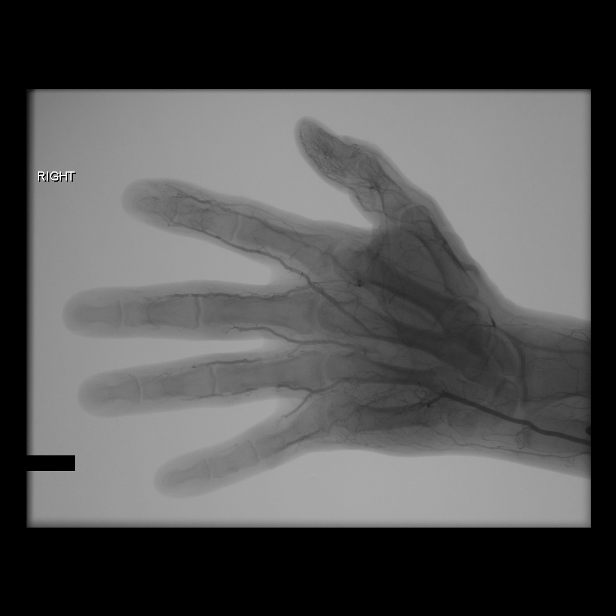
[im 19/19]
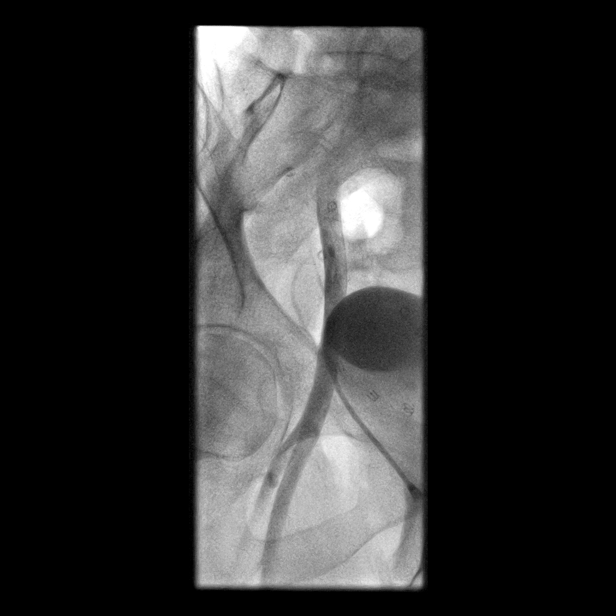

[12 of 24 positions shown; findings below may reference images not displayed]

EXAM:
1. ULTRASOUND GUIDANCE FOR VASCULAR ACCESS OF THE RIGHT COMMON
FEMORAL ARTERY
2. RIGHT UPPER EXTREMITY ARTERIOGRAPHY WITH SELECTIVE
CATHETERIZATION TO THE LEVEL OF THE RIGHT BRACHIAL ARTERY

MEDICATIONS:
Sedation:  5.0 mg IV Versed, 100 mcg IV fentanyl.

Additional medications: 200 mcg intra-arterial nitroglycerin
administered into the right brachial artery

Total Moderate Sedation Time:  45 minutes.

CONTRAST:  82 mL Visipaque 320

FLUOROSCOPY TIME:  5 min and 30 seconds.

PROCEDURE:
Informed consent was obtained from the patient prior to the
procedure. Maximal barrier sterile technique was utilized including
caps, mask, sterile gowns, sterile gloves, sterile drape, hand
hygiene and skin antiseptic.

The right groin was prepped with Betadine and sterilely draped.
Local anesthesia was provided with 1% lidocaine. Ultrasound was used
to confirm patency of the right common femoral artery.

Under ultrasound guidance, a 21 gauge needle was advanced into the
right common femoral artery. After securing access with a
micropuncture [DATE] French sheath was placed over a guidewire.

A 5 French JB 1 catheter was advanced to the level of the aortic
arch. The innominate artery was selectively catheterized. Selective
arteriography was performed.

The catheter was further advanced over a guidewire into the right
axillary artery and additional selective arteriography performed of
the upper arm. The catheter was then further advanced over a
guidewire into the right brachial artery and additional selective
arteriography performed in different stages to image blood supply of
the entire right upper extremity, including the hand and all of the
fingers. Hand arteriography was performed prior to, and following,
administration of 200 mcg intra-arterial nitroglycerin through the
catheter.

The JB 1 catheter was removed. Oblique arteriography was performed
at the level of the femoral puncture site. Arteriotomy closure was
performed with the Cordis ExoSeal device.

COMPLICATIONS:
None
FINDINGS: The innominate artery is normally patent. Visualized segments of the
proximal right common carotid artery and vertebral artery show
normal patency.

The right subclavian, axillary and brachial arteries show normal
patency. There is bifurcation of the brachial artery at the
antecubital fossa with a normally patent radial artery seen
extending to the wrist. Proximal interosseous and ulnar arteries are
normally patent. The radial artery is dominant compared to the ulnar
artery.

The ulnar artery becomes occluded at the level of the distal carpus.
A preserved side branch does supply some small collateral arteries
to the region of the base of the fourth finger. There is no
reconstitution identified of a superficial palmar arch.

The radial artery forms a deep palmar arch and shows normal patency.
Supply to the first and second digits is normal. Occlusive disease
is identified in digital arteries supplying the third, fourth and
fifth fingers. Segmental occlusion of the digital artery along the
ulnar aspect of the third finger present beginning just proximal to
the PIP joint. There is some reconstitution distally via branches of
the ulnar aspect digital artery. Occlusive disease of the third
digit did not show significant improvement after administration of
nitroglycerin.

Segmental occlusions present of both digital arteries supplying the
fourth finger. The radial aspect artery is attenuated proximally and
also shows segmental occlusion distally. The ulnar aspect artery
shows segmental occlusion proximally with some distal
reconstitution. Flow to the tip of the fourth finger did show some
mild improvement after administration of nitroglycerin.

Significant digital occlusions are identified of both digital
arteries in the fifth finger. Both arteries are occluded proximally.
There was little flow beyond the PIP joint on the initial
arteriogram. After administration of nitroglycerin, there was some
faint reconstitution of segments of the digital arteries on both
sides of the middle phalanx. The distal aspect of the fifth finger
continued to show very little flow on post vasodilatation
angiography.

No aneurysms or vascular malformations are identified in the hand or
rest of the upper extremity. There is no evidence of abnormality on
the venous phases.
IMPRESSION: Distal ulnar artery occlusion at the level of the distal carpal row.
There is no significant reconstitution of a superficial palmar arch.
Digital occlusive disease is identified in the third, fourth and
fifth digits. The most significant occlusive disease is in the fifth
digit.

## 2014-08-13 MED ORDER — MIDAZOLAM HCL 2 MG/2ML IJ SOLN
INTRAMUSCULAR | Status: AC
  Filled 2014-08-13: qty 4

## 2014-08-13 MED ORDER — FENTANYL CITRATE 0.05 MG/ML IJ SOLN
INTRAMUSCULAR | Status: AC
  Filled 2014-08-13: qty 2

## 2014-08-13 MED ORDER — LIDOCAINE HCL 1 % IJ SOLN
INTRAMUSCULAR | Status: AC
  Filled 2014-08-13: qty 20

## 2014-08-13 MED ORDER — MIDAZOLAM HCL 2 MG/2ML IJ SOLN
INTRAMUSCULAR | Status: AC
  Filled 2014-08-13: qty 2

## 2014-08-13 MED ORDER — NITROGLYCERIN 1 MG/10 ML FOR IR/CATH LAB: 200.0000 ug | INTRA_ARTERIAL | Status: DC

## 2014-08-13 MED ORDER — IODIXANOL 320 MG/ML IV SOLN
100.0000 mL | Freq: Once | INTRAVENOUS | Status: AC | PRN
  Administered 2014-08-13: 1 mL via INTRA_ARTERIAL

## 2014-08-13 MED ORDER — FENTANYL CITRATE 0.05 MG/ML IJ SOLN
INTRAMUSCULAR | Status: AC | PRN
  Administered 2014-08-13: 100 ug via INTRAVENOUS

## 2014-08-13 MED ORDER — MIDAZOLAM HCL 2 MG/2ML IJ SOLN
INTRAMUSCULAR | Status: AC | PRN
  Administered 2014-08-13 (×2): 1 mg via INTRAVENOUS
  Administered 2014-08-13: 2 mg via INTRAVENOUS
  Administered 2014-08-13: 1 mg via INTRAVENOUS

## 2014-08-13 MED ORDER — SODIUM CHLORIDE 0.9 % IV SOLN
INTRAVENOUS | Status: DC
Start: 2014-08-13 — End: 2014-08-14
  Administered 2014-08-13: 12:00:00 via INTRAVENOUS

## 2014-08-13 NOTE — H&P (Signed)
Patient seen and examined.  Pallor and cool third, fourth and fifth digits.  Absent ulnar pulse. Will proceed with right upper extremity arteriography today.

## 2014-08-13 NOTE — H&P (Signed)
Chief Complaint: "I am here for a procedure for my right hand."  Referring Physician(s): Weingold,Matthew  History of Present Illness: Daniel [name] is a 53 y.o. male who has been seen by Dr. Burney Gauze and concern for right ulnar artery thrombosis with c/o right hand pain x 2 weeks with changes to the 3-5 digits including numbness, loss of color, pain and pin pricking sensation on fingertips. He is scheduled today for a right upper extremity arteriogram. He denies any chest pain, shortness of breath or palpitations. He denies any active signs of bleeding or excessive bruising. He denies any recent fever or chills. The patient denies any history of sleep apnea or chronic oxygen use. He has previously tolerated sedation without complications. He denies any known renal dysfunction or allergies to iodinated contrast. He does admit to prior surgery in his right forearm with a metal plate. 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    Past Medical History  Diagnosis Date  . Anxiety   . GERD (gastroesophageal reflux disease)   . Arthritis     Past Surgical History  Procedure Laterality Date  . Fracture surgery      multiple fracture surgeries  . Hernia repair      Allergies: Vicodin  Medications: Prior to Admission medications   Medication Sig Start Date End Date Taking? Authorizing Provider  citalopram (CELEXA) 40 MG tablet Take 40 mg by mouth at bedtime.    Yes Historical Provider, MD  ibuprofen (ADVIL,MOTRIN) 200 MG tablet Take 400 mg by mouth once as needed for headache or mild pain.   Yes Historical Provider, MD  Lansoprazole (PREVACID PO) Take 1 tablet by mouth every morning.   Yes Historical Provider, MD    History reviewed. No pertinent family history.  History   Social History  . Marital Status: Married    Spouse Name: N/A    Number of Children: N/A  . Years of Education: N/A   Social History Main  Topics  . Smoking status: Never Smoker   . Smokeless tobacco: Never Used  . Alcohol Use: No  . Drug Use: No  . Sexual Activity: None   Other Topics Concern  . None   Social History Narrative  . None    Review of Systems: A 12 point ROS discussed and pertinent positives are indicated in the HPI above.  All other systems are negative.  Review of Systems  Vital Signs: BP 122/91  Pulse 63  Temp(Src) 98.1 F (36.7 C) (Oral)  Resp 16  Ht 6\' 2"  (1.88 m)  Wt 185 lb (83.915 kg)  BMI 23.74 kg/m2  SpO2 98%  Physical Exam  Constitutional: He is oriented to person, place, and time. No distress.  HENT:  Head: Normocephalic and atraumatic.  Neck: No tracheal deviation present.  Cardiovascular: Normal rate and regular rhythm.  Exam reveals no gallop and no friction rub.   No murmur heard. Right radial pulse intact, skin warm without skin changes.  Pulmonary/Chest: Effort normal and breath sounds normal. No respiratory distress. He has no wheezes. He has no rales.  Abdominal: Soft. Bowel sounds are normal. He exhibits no distension. There is no tenderness.  Neurological: He is alert and oriented to person, place, and time.  Skin: Skin is warm and dry. He is not diaphoretic. No erythema. No pallor.  Psychiatric: He has a normal mood and affect. His behavior is normal. Thought content normal.  Imaging: No results found.  Labs:  CBC:  Recent Labs  08/13/14 1141  WBC 6.0  HGB 15.2  HCT 42.3  PLT 220    COAGS:  Recent Labs  08/13/14 1141  INR 0.97    BMP:  Recent Labs  08/13/14 1141  NA 139  K 3.8  CL 101  CO2 24  GLUCOSE 93  BUN 13  CALCIUM 9.1  CREATININE 0.97  GFRNONAA >90  GFRAA >90    Assessment and Plan: Right upper extremity pain with skin changes, temperature changes and sensation changes in digits 3-5 Seen by Dr. Burney Gauze concern for ulnar artery thrombosis.  Request for right upper extremity arteriogram with moderate sedation Patient has  been NPO, no blood thinners, labs reviewed Risks and Benefits discussed with the patient. All of the patient's questions were answered, patient is agreeable to proceed. Consent signed and in chart.     SignedHedy Jacob 08/13/2014, 1:38 PM

## 2014-08-13 NOTE — Discharge Instructions (Signed)
Conscious Sedation, Adult, Care After Refer to this sheet in the next few weeks. These instructions provide you with information on caring for yourself after your procedure. Your health care provider may also give you more specific instructions. Your treatment has been planned according to current medical practices, but problems sometimes occur. Call your health care provider if you have any problems or questions after your procedure. WHAT TO EXPECT AFTER THE PROCEDURE  After your procedure:  You may feel sleepy, clumsy, and have poor balance for several hours.  Vomiting may occur if you eat too soon after the procedure. HOME CARE INSTRUCTIONS  Do not participate in any activities where you could become injured for at least 24 hours. Do not:  Drive.  Swim.  Ride a bicycle.  Operate heavy machinery.  Cook.  Use power tools.  Climb ladders.  Work from a high place.  Do not make important decisions or sign legal documents until you are improved.  If you vomit, drink water, juice, or soup when you can drink without vomiting. Make sure you have little or no nausea before eating solid foods.  Only take over-the-counter or prescription medicines for pain, discomfort, or fever as directed by your health care provider.  Make sure you and your family fully understand everything about the medicines given to you, including what side effects may occur.  You should not drink alcohol, take sleeping pills, or take medicines that cause drowsiness for at least 24 hours.  If you smoke, do not smoke without supervision.  If you are feeling better, you may resume normal activities 24 hours after you were sedated.  Keep all appointments with your health care provider. SEEK MEDICAL CARE IF:  Your skin is pale or bluish in color.  You continue to feel nauseous or vomit.  Your pain is getting worse and is not helped by medicine.  You have bleeding or swelling.  You are still sleepy or  feeling clumsy after 24 hours. SEEK IMMEDIATE MEDICAL CARE IF:  You develop a rash.  You have difficulty breathing.  You develop any type of allergic problem.  You have a fever. MAKE SURE YOU:  Understand these instructions.  Will watch your condition.  Will get help right away if you are not doing well or get worse. Document Released: 08/08/2013 Document Reviewed: 08/08/2013 [facility] Patient Information 2015 [place], [place]. This information is not intended to replace advice given to you by your health care provider. Make sure you discuss any questions you have with your health care provider. Arteriogram Care After These instructions give you information on caring for yourself after your procedure. Your doctor may also give you more specific instructions. Call your doctor if you have any problems or questions after your procedure. HOME CARE  Keep your leg straight for at least 6 hours.  Do not bathe, swim, or use a hot tub until directed by your doctor. You can shower.  Do not lift anything heavier than 10 pounds (about a gallon of milk) for 2 days.  Do not walk a lot, run, or drive for 2 days.  Return to normal activities in 2 days or as told by your doctor. Finding out the results of your test Ask when your test results will be ready. Make sure you get your test results. GET HELP RIGHT AWAY IF:   You have fever.  You have more pain in your leg.  The leg that was cut is:  Bleeding.  Puffy (swollen) or red.  Cold.  Pale or changes color.  Weak.  Tingly or numb. If you go to the Emergency Room, tell your nurse that you have had an arteriogram. Take this paper with you to show the nurse. MAKE SURE YOU:  Understand these instructions.  Will watch your condition.  Will get help right away if you are not doing well or get worse. Document Released: 01/14/2009 Document Revised: 10/23/2013 Document Reviewed: 01/14/2009 [facility] Patient Information 2015  [place], [place]. This information is not intended to replace advice given to you by your health care provider. Make sure you discuss any questions you have with your health care provider.

## 2014-08-13 NOTE — Procedures (Signed)
Procedure:  Right upper extremity arteriography Findings:  Distal ulnar occlusion and multiple 3,4,5th digital occlusions. See dictated Radiology procedure note. No complications.

## 2014-08-19 ENCOUNTER — Other Ambulatory Visit: Payer: Self-pay | Admitting: Orthopedic Surgery

## 2014-08-26 ENCOUNTER — Encounter (HOSPITAL_BASED_OUTPATIENT_CLINIC_OR_DEPARTMENT_OTHER): Payer: Self-pay | Admitting: *Deleted

## 2014-08-26 DIAGNOSIS — S60511A Abrasion of right hand, initial encounter: Secondary | ICD-10-CM

## 2014-08-26 HISTORY — DX: Abrasion of right hand, initial encounter: S60.511A

## 2014-08-30 ENCOUNTER — Ambulatory Visit (HOSPITAL_BASED_OUTPATIENT_CLINIC_OR_DEPARTMENT_OTHER)
Admission: RE | Admit: 2014-08-30 | Discharge: 2014-08-30 | Disposition: A | Discharge status: Home / Self Care | Payer: 59 | Source: Ambulatory Visit | Attending: Orthopedic Surgery | Admitting: Orthopedic Surgery

## 2014-08-30 ENCOUNTER — Encounter (HOSPITAL_BASED_OUTPATIENT_CLINIC_OR_DEPARTMENT_OTHER)
Admission: RE | Disposition: A | Discharge status: Home / Self Care | Payer: Self-pay | Source: Ambulatory Visit | Attending: Orthopedic Surgery

## 2014-08-30 ENCOUNTER — Ambulatory Visit (HOSPITAL_BASED_OUTPATIENT_CLINIC_OR_DEPARTMENT_OTHER): Payer: 59 | Admitting: Anesthesiology

## 2014-08-30 ENCOUNTER — Encounter (HOSPITAL_BASED_OUTPATIENT_CLINIC_OR_DEPARTMENT_OTHER): Payer: Self-pay | Admitting: *Deleted

## 2014-08-30 ENCOUNTER — Encounter (HOSPITAL_BASED_OUTPATIENT_CLINIC_OR_DEPARTMENT_OTHER): Payer: 59 | Admitting: Anesthesiology

## 2014-08-30 DIAGNOSIS — Z79899 Other long term (current) drug therapy: Secondary | ICD-10-CM | POA: Insufficient documentation

## 2014-08-30 DIAGNOSIS — F419 Anxiety disorder, unspecified: Secondary | ICD-10-CM | POA: Diagnosis not present

## 2014-08-30 DIAGNOSIS — Z885 Allergy status to narcotic agent status: Secondary | ICD-10-CM | POA: Insufficient documentation

## 2014-08-30 DIAGNOSIS — K219 Gastro-esophageal reflux disease without esophagitis: Secondary | ICD-10-CM | POA: Insufficient documentation

## 2014-08-30 DIAGNOSIS — I742 Embolism and thrombosis of arteries of the upper extremities: Secondary | ICD-10-CM | POA: Diagnosis not present

## 2014-08-30 DIAGNOSIS — G5621 Lesion of ulnar nerve, right upper limb: Secondary | ICD-10-CM | POA: Insufficient documentation

## 2014-08-30 HISTORY — DX: Embolism and thrombosis of arteries of the upper extremities: I74.2

## 2014-08-30 HISTORY — DX: Other specified postprocedural states: R11.2

## 2014-08-30 HISTORY — DX: Other specified postprocedural states: Z98.890

## 2014-08-30 HISTORY — DX: Abrasion of right hand, initial encounter: S60.511A

## 2014-08-30 HISTORY — DX: Lesion of ulnar nerve, right upper limb: G56.21

## 2014-08-30 HISTORY — PX: ULNAR TUNNEL RELEASE: SHX820

## 2014-08-30 HISTORY — DX: Presence of dental prosthetic device (complete) (partial): Z97.2

## 2014-08-30 HISTORY — DX: Dental restoration status: Z98.811

## 2014-08-30 HISTORY — PX: ARTERY REPAIR: SHX5117

## 2014-08-30 LAB — POCT HEMOGLOBIN-HEMACUE: Hemoglobin: 15.6 g/dL (ref 13.0–17.0)

## 2014-08-30 SURGERY — RELEASE, CUBITAL TUNNEL
Anesthesia: General | Site: Wrist | Laterality: Right

## 2014-08-30 MED ORDER — MIDAZOLAM HCL 2 MG/2ML IJ SOLN
1.0000 mg | INTRAMUSCULAR | Status: DC | PRN
  Administered 2014-08-30: 2 mg via INTRAVENOUS

## 2014-08-30 MED ORDER — FENTANYL CITRATE 0.05 MG/ML IJ SOLN
50.0000 ug | INTRAMUSCULAR | Status: DC | PRN
  Administered 2014-08-30: 100 ug via INTRAVENOUS

## 2014-08-30 MED ORDER — FENTANYL CITRATE 0.05 MG/ML IJ SOLN
INTRAMUSCULAR | Status: AC
  Filled 2014-08-30: qty 2

## 2014-08-30 MED ORDER — ONDANSETRON HCL 4 MG/2ML IJ SOLN
INTRAMUSCULAR | Status: DC | PRN
  Administered 2014-08-30: 4 mg via INTRAVENOUS

## 2014-08-30 MED ORDER — HYDROMORPHONE HCL 1 MG/ML IJ SOLN: 0.2500 mg | INTRAMUSCULAR | Status: DC | PRN

## 2014-08-30 MED ORDER — DEXAMETHASONE SODIUM PHOSPHATE 4 MG/ML IJ SOLN
INTRAMUSCULAR | Status: DC | PRN
  Administered 2014-08-30: 10 mg via INTRAVENOUS

## 2014-08-30 MED ORDER — OXYCODONE HCL 5 MG PO TABS: 5.0000 mg | ORAL_TABLET | Freq: Once | ORAL | Status: DC | PRN

## 2014-08-30 MED ORDER — OXYCODONE HCL 5 MG/5ML PO SOLN: 5.0000 mg | Freq: Once | ORAL | Status: DC | PRN

## 2014-08-30 MED ORDER — LACTATED RINGERS IV SOLN
INTRAVENOUS | Status: DC
  Administered 2014-08-30: 14:00:00 via INTRAVENOUS
  Administered 2014-08-30: 10 mL/h via INTRAVENOUS
  Administered 2014-08-30: 13:00:00 via INTRAVENOUS

## 2014-08-30 MED ORDER — MIDAZOLAM HCL 5 MG/5ML IJ SOLN
INTRAMUSCULAR | Status: DC | PRN
  Administered 2014-08-30: 2 mg via INTRAVENOUS

## 2014-08-30 MED ORDER — MIDAZOLAM HCL 2 MG/2ML IJ SOLN
INTRAMUSCULAR | Status: AC
  Filled 2014-08-30: qty 2

## 2014-08-30 MED ORDER — OXYCODONE-ACETAMINOPHEN 5-325 MG PO TABS: 1.0000 | ORAL_TABLET | ORAL | Status: DC | PRN

## 2014-08-30 MED ORDER — SUFENTANIL CITRATE 50 MCG/ML IV SOLN
INTRAVENOUS | Status: AC
  Filled 2014-08-30: qty 1

## 2014-08-30 MED ORDER — BUPIVACAINE HCL (PF) 0.25 % IJ SOLN
INTRAMUSCULAR | Status: AC
  Filled 2014-08-30: qty 30

## 2014-08-30 MED ORDER — ONDANSETRON HCL 4 MG/2ML IJ SOLN: 4.0000 mg | Freq: Once | INTRAMUSCULAR | Status: DC | PRN

## 2014-08-30 MED ORDER — CEFAZOLIN SODIUM-DEXTROSE 2-3 GM-% IV SOLR
INTRAVENOUS | Status: AC
  Filled 2014-08-30: qty 50

## 2014-08-30 MED ORDER — LIDOCAINE HCL (PF) 1 % IJ SOLN
INTRAMUSCULAR | Status: AC
  Filled 2014-08-30: qty 30

## 2014-08-30 MED ORDER — PROPOFOL 10 MG/ML IV BOLUS
INTRAVENOUS | Status: DC | PRN
  Administered 2014-08-30: 200 mg via INTRAVENOUS
  Administered 2014-08-30: 100 mg via INTRAVENOUS

## 2014-08-30 MED ORDER — LIDOCAINE HCL (PF) 1 % IJ SOLN
INTRAVENOUS | Status: DC | PRN
  Administered 2014-08-30: 15:00:00

## 2014-08-30 MED ORDER — CHLORHEXIDINE GLUCONATE 4 % EX LIQD: 60.0000 mL | Freq: Once | CUTANEOUS | Status: DC

## 2014-08-30 MED ORDER — SCOPOLAMINE 1 MG/3DAYS TD PT72
1.0000 | MEDICATED_PATCH | TRANSDERMAL | Status: DC
  Administered 2014-08-30: 1.5 mg via TRANSDERMAL

## 2014-08-30 MED ORDER — LIDOCAINE HCL 2 % IJ SOLN
INTRAMUSCULAR | Status: AC
  Filled 2014-08-30: qty 20

## 2014-08-30 MED ORDER — MIDAZOLAM HCL 2 MG/ML PO SYRP: 12.0000 mg | ORAL_SOLUTION | Freq: Once | ORAL | Status: DC | PRN

## 2014-08-30 MED ORDER — LIDOCAINE HCL (CARDIAC) 20 MG/ML IV SOLN
INTRAVENOUS | Status: DC | PRN
  Administered 2014-08-30: 50 mg via INTRAVENOUS

## 2014-08-30 MED ORDER — 0.9 % SODIUM CHLORIDE (POUR BTL) OPTIME
TOPICAL | Status: DC | PRN
  Administered 2014-08-30: 200 mL

## 2014-08-30 MED ORDER — HEPARIN SODIUM (PORCINE) 1000 UNIT/ML IJ SOLN
INTRAMUSCULAR | Status: AC
  Filled 2014-08-30: qty 1

## 2014-08-30 MED ORDER — SCOPOLAMINE 1 MG/3DAYS TD PT72
MEDICATED_PATCH | TRANSDERMAL | Status: AC
  Filled 2014-08-30: qty 1

## 2014-08-30 MED ORDER — BUPIVACAINE-EPINEPHRINE (PF) 0.5% -1:200000 IJ SOLN
INTRAMUSCULAR | Status: DC | PRN
  Administered 2014-08-30: 25 mL via PERINEURAL

## 2014-08-30 MED ORDER — CEFAZOLIN SODIUM-DEXTROSE 2-3 GM-% IV SOLR
2.0000 g | INTRAVENOUS | Status: AC
Start: 2014-08-30 — End: 2014-08-30
  Administered 2014-08-30: 2 g via INTRAVENOUS

## 2014-08-30 MED ORDER — SUFENTANIL CITRATE 50 MCG/ML IV SOLN
INTRAVENOUS | Status: DC | PRN
  Administered 2014-08-30 (×2): 5 ug via INTRAVENOUS

## 2014-08-30 SURGICAL SUPPLY — 84 items
APL SKNCLS STERI-STRIP NONHPOA (GAUZE/BANDAGES/DRESSINGS)
BAG DECANTER FOR FLEXI CONT (MISCELLANEOUS) ×5 IMPLANT
BANDAGE ELASTIC 3 VELCRO ST LF (GAUZE/BANDAGES/DRESSINGS) ×5 IMPLANT
BANDAGE ELASTIC 4 VELCRO ST LF (GAUZE/BANDAGES/DRESSINGS) IMPLANT
BENZOIN TINCTURE PRP APPL 2/3 (GAUZE/BANDAGES/DRESSINGS) IMPLANT
BLADE MINI RND TIP GREEN BEAV (BLADE) ×5 IMPLANT
BLADE SURG 15 STRL LF DISP TIS (BLADE) ×3 IMPLANT
BLADE SURG 15 STRL SS (BLADE) ×5
BNDG CMPR 9X4 STRL LF SNTH (GAUZE/BANDAGES/DRESSINGS) ×3
BNDG CMPR MD 5X2 ELC HKLP STRL (GAUZE/BANDAGES/DRESSINGS) ×3
BNDG COHESIVE 1X5 TAN STRL LF (GAUZE/BANDAGES/DRESSINGS) IMPLANT
BNDG ELASTIC 2 VLCR STRL LF (GAUZE/BANDAGES/DRESSINGS) ×5 IMPLANT
BNDG ESMARK 4X9 LF (GAUZE/BANDAGES/DRESSINGS) ×5 IMPLANT
BNDG GAUZE ELAST 4 BULKY (GAUZE/BANDAGES/DRESSINGS) ×5 IMPLANT
CLOSURE WOUND 1/2 X4 (GAUZE/BANDAGES/DRESSINGS) ×2
CORDS BIPOLAR (ELECTRODE) ×5 IMPLANT
COVER BACK TABLE 60X90IN (DRAPES) ×5 IMPLANT
CUFF TOURNIQUET SINGLE 18IN (TOURNIQUET CUFF) ×5 IMPLANT
DECANTER SPIKE VIAL GLASS SM (MISCELLANEOUS) IMPLANT
DRAPE EXTREMITY T 121X128X90 (DRAPE) ×5 IMPLANT
DRAPE SURG 17X23 STRL (DRAPES) ×5 IMPLANT
DRSG PAD ABDOMINAL 8X10 ST (GAUZE/BANDAGES/DRESSINGS) ×5 IMPLANT
DURAPREP 26ML APPLICATOR (WOUND CARE) ×5 IMPLANT
GAUZE SPONGE 4X4 12PLY STRL (GAUZE/BANDAGES/DRESSINGS) ×5 IMPLANT
GAUZE SPONGE 4X4 16PLY XRAY LF (GAUZE/BANDAGES/DRESSINGS) IMPLANT
GAUZE XEROFORM 1X8 LF (GAUZE/BANDAGES/DRESSINGS) ×5 IMPLANT
GLOVE BIO SURGEON STRL SZ8.5 (GLOVE) ×5 IMPLANT
GLOVE BIOGEL M STRL SZ7.5 (GLOVE) IMPLANT
GLOVE BIOGEL PI IND STRL 8.5 (GLOVE) ×3 IMPLANT
GLOVE BIOGEL PI INDICATOR 8.5 (GLOVE) ×2
GLOVE SURG ORTHO 8.0 STRL STRW (GLOVE) ×5 IMPLANT
GLOVE SURG SS PI 7.0 STRL IVOR (GLOVE) ×5 IMPLANT
GLOVE SURG SS PI 8.5 STRL IVOR (GLOVE) ×2
GLOVE SURG SS PI 8.5 STRL STRW (GLOVE) ×3 IMPLANT
GLOVE SURG SYN 8.0 (GLOVE) ×10 IMPLANT
GOWN STRL REUS W/ TWL LRG LVL3 (GOWN DISPOSABLE) ×3 IMPLANT
GOWN STRL REUS W/TWL LRG LVL3 (GOWN DISPOSABLE) ×5
GOWN STRL REUS W/TWL XL LVL3 (GOWN DISPOSABLE) ×10 IMPLANT
IV LACTATED RINGERS 500ML (IV SOLUTION) ×5 IMPLANT
LOOP VESSEL MAXI BLUE (MISCELLANEOUS) IMPLANT
NEEDLE HYPO 25X1 1.5 SAFETY (NEEDLE) IMPLANT
NS IRRIG 1000ML POUR BTL (IV SOLUTION) ×5 IMPLANT
PACK BASIN DAY SURGERY FS (CUSTOM PROCEDURE TRAY) ×5 IMPLANT
PAD CAST 3X4 CTTN HI CHSV (CAST SUPPLIES) ×3 IMPLANT
PAD CAST 4YDX4 CTTN HI CHSV (CAST SUPPLIES) ×3 IMPLANT
PADDING CAST ABS 4INX4YD NS (CAST SUPPLIES) ×2
PADDING CAST ABS COTTON 4X4 ST (CAST SUPPLIES) ×3 IMPLANT
PADDING CAST COTTON 3X4 STRL (CAST SUPPLIES) ×5
PADDING CAST COTTON 4X4 STRL (CAST SUPPLIES) ×5
PADDING UNDERCAST 2 STRL (CAST SUPPLIES)
PADDING UNDERCAST 2X4 STRL (CAST SUPPLIES) IMPLANT
PASSER SUT SWANSON 36MM LOOP (INSTRUMENTS) IMPLANT
SHEET MEDIUM DRAPE 40X70 STRL (DRAPES) ×5 IMPLANT
SLEEVE SCD COMPRESS KNEE MED (MISCELLANEOUS) ×5 IMPLANT
SLING ARM LRG ADULT FOAM STRAP (SOFTGOODS) ×5 IMPLANT
SLING ARM MED ADULT FOAM STRAP (SOFTGOODS) IMPLANT
SPEAR EYE SURG WECK-CEL (MISCELLANEOUS) ×5 IMPLANT
SPLINT PLASTER CAST XFAST 4X15 (CAST SUPPLIES) ×45 IMPLANT
SPLINT PLASTER XTRA FAST SET 4 (CAST SUPPLIES) ×30
STOCKINETTE 4X48 STRL (DRAPES) ×5 IMPLANT
STRIP CLOSURE SKIN 1/2X4 (GAUZE/BANDAGES/DRESSINGS) ×8 IMPLANT
SUT ETHIBOND 2 OS 4 DA (SUTURE) IMPLANT
SUT ETHIBOND 3-0 V-5 (SUTURE) IMPLANT
SUT ETHILON 4 0 PS 2 18 (SUTURE) ×15 IMPLANT
SUT ETHILON 5 0 PS 2 18 (SUTURE) IMPLANT
SUT FIBERWIRE 3-0 18 TAPR NDL (SUTURE)
SUT FIBERWIRE 4-0 18 TAPR NDL (SUTURE)
SUT NYLON 9 0 VRM6 (SUTURE) ×15 IMPLANT
SUT PROLENE 3 0 PS 2 (SUTURE) ×10 IMPLANT
SUT PROLENE 6 0 P 1 18 (SUTURE) IMPLANT
SUT SILK 4 0 PS 2 (SUTURE) ×5 IMPLANT
SUT VIC AB 2-0 SH 27 (SUTURE) ×5
SUT VIC AB 2-0 SH 27XBRD (SUTURE) ×3 IMPLANT
SUT VIC AB 3-0 X1 27 (SUTURE) IMPLANT
SUT VICRYL 4-0 PS2 18IN ABS (SUTURE) ×5 IMPLANT
SUT VICRYL RAPIDE 4-0 (SUTURE) IMPLANT
SUT VICRYL RAPIDE 4/0 PS 2 (SUTURE) IMPLANT
SUTURE FIBERWR 3-0 18 TAPR NDL (SUTURE) IMPLANT
SUTURE FIBERWR 4-0 18 TAPR NDL (SUTURE) IMPLANT
SYR BULB 3OZ (MISCELLANEOUS) ×5 IMPLANT
SYRINGE 10CC LL (SYRINGE) ×10 IMPLANT
TOWEL OR 17X24 6PK STRL BLUE (TOWEL DISPOSABLE) ×5 IMPLANT
TUBE FEEDING 5FR 15 INCH (TUBING) IMPLANT
UNDERPAD 30X30 INCONTINENT (UNDERPADS AND DIAPERS) ×5 IMPLANT

## 2014-08-30 NOTE — Op Note (Signed)
See note [id number]

## 2014-08-30 NOTE — H&P (Signed)
Daniel [name] is an 53 y.o. male.   Chief Complaint: right hand ulnar pain and numbness HPI: as above with ncv posistive for cubital tunnel syndrome and arteriogram positive for ulnar artery occlusion at wrist  Past Medical History  Diagnosis Date  . Anxiety   . GERD (gastroesophageal reflux disease)   . Arthritis   . PONV (postoperative nausea and vomiting)   . Cubital tunnel syndrome on right 08/2014  . 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 Dental bridge present     lower left  . Dental crown present     lower right  . Abrasion of hand, right 08/26/2014    Past Surgical History  Procedure Laterality Date  . Umbilical hernia repair    . Tonsillectomy    . Wrist arthroscopy with ulna shortening Right 12/28/2007  . Elbow arthroscopy with tendon reconstruction Left 07/01/2011    ECRL and brevis    History reviewed. No pertinent family history. Social History:  reports that he has never smoked. He has never used smokeless tobacco. He reports that he drinks alcohol. He reports that he does not use illicit drugs.  Allergies:  Allergies  Allergen Reactions  . Vicodin [Hydrocodone-Acetaminophen] Itching    Medications Prior to Admission  Medication Sig Dispense Refill  . citalopram (CELEXA) 40 MG tablet Take 40 mg by mouth at bedtime.       Marland Kitchen ibuprofen (ADVIL,MOTRIN) 200 MG tablet Take 400 mg by mouth once as needed for headache or mild pain.      . Lansoprazole (PREVACID PO) Take 1 tablet by mouth every morning.        Results for orders placed during the hospital encounter of 08/30/14 (from the past 48 hour(s))  POCT HEMOGLOBIN-HEMACUE     Status: None   Collection Time    08/30/14 12:22 PM      Result Value Ref Range   Hemoglobin 15.6  13.0 - 17.0 g/dL   No results found.  Review of Systems  All other systems reviewed and are negative.   Blood pressure 128/72, pulse 69, temperature 97.9 F (36.6 C), temperature source Oral, resp. rate 18, height 6\' 2"  (1.88  m), weight 83.462 kg (184 lb), SpO2 99.00%. Physical Exam  Constitutional: He is oriented to person, place, and time. He appears well-developed and well-nourished.  HENT:  Head: Normocephalic and atraumatic.  Cardiovascular: Normal rate.   Respiratory: Effort normal.  Musculoskeletal:       Right elbow: Tenderness found. Medial epicondyle tenderness noted.       Right wrist: He exhibits tenderness.  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  Skin: Skin is warm.  Psychiatric: He has a normal mood and affect. His behavior is normal. Judgment and thought content normal.     Assessment/Plan As above   Plan cubital tunnel release and artery repair vs vein graft  Saniya Tranchina A 08/30/2014, 12:54 PM

## 2014-08-30 NOTE — Discharge Instructions (Signed)
Start taking a baby aspirin everyday  Call Dr. Burney Gauze office for followup appointment.   Post Anesthesia Home Care Instructions  Activity: Get plenty of rest for the remainder of the day. A responsible adult should stay with you for 24 hours following the procedure.  For the next 24 hours, DO NOT: -Drive a car -Paediatric nurse -Drink alcoholic beverages -Take any medication unless instructed by your physician -Make any legal decisions or sign important papers.  Meals: Start with liquid foods such as gelatin or soup. Progress to regular foods as tolerated. Avoid greasy, spicy, heavy foods. If nausea and/or vomiting occur, drink only clear liquids until the nausea and/or vomiting subsides. Call your physician if vomiting continues.  Special Instructions/Symptoms: Your throat may feel dry or sore from the anesthesia or the breathing tube placed in your throat during surgery. If this causes discomfort, gargle with warm salt water. The discomfort should disappear within 24 hours.   Regional Anesthesia Blocks  1. Numbness or the inability to move the "blocked" extremity may last from 3-48 hours after placement. The length of time depends on the medication injected and your individual response to the medication. If the numbness is not going away after 48 hours, call your surgeon.  2. The extremity that is blocked will need to be protected until the numbness is gone and the  Strength has returned. Because you cannot feel it, you will need to take extra care to avoid injury. Because it may be weak, you may have difficulty moving it or using it. You may not know what position it is in without looking at it while the block is in effect.  3. For blocks in the legs and feet, returning to weight bearing and walking needs to be done carefully. You will need to wait until the numbness is entirely gone and the strength has returned. You should be able to move your leg and foot normally before you try  and bear weight or walk. You will need someone to be with you when you first try to ensure you do not fall and possibly risk injury.  4. Bruising and tenderness at the needle site are common side effects and will resolve in a few days.  5. Persistent numbness or new problems with movement should be communicated to the surgeon or the [facility] [phone] [place] [phone]).

## 2014-08-30 NOTE — Anesthesia Procedure Notes (Signed)
Anesthesia Regional Block:  Supraclavicular block  Pre-Anesthetic Checklist: ,, timeout performed, Correct Patient, Correct Site, Correct Laterality, Correct Procedure, Correct Position, site marked, Risks and benefits discussed,  Surgical consent,  Pre-op evaluation,  At surgeon's request and post-op pain management  Laterality: Right and Upper  Prep: chloraprep       Needles:  Injection technique: Single-shot  Needle Type: Echogenic Stimulator Needle     Needle Length: 5cm 5 cm Needle Gauge: 21 and 21 G    Additional Needles:  Procedures: ultrasound guided (picture in chart) Supraclavicular block Narrative:  Start time: 08/30/2014 12:42 PM End time: 08/30/2014 12:47 PM Injection made incrementally with aspirations every 5 mL.  Performed by: Personally  Anesthesiologist: Lorrene Reid

## 2014-08-30 NOTE — Anesthesia Preprocedure Evaluation (Signed)
Anesthesia Evaluation  Patient identified by MRN, date of birth, ID band Patient awake    Reviewed: Allergy & Precautions, H&P , NPO status , Patient's Chart, lab work & pertinent test results  History of Anesthesia Complications (+) PONV  Airway Mallampati: I  TM Distance: >3 FB Neck ROM: Full    Dental  (+) Teeth Intact, Dental Advisory Given   Pulmonary  breath sounds clear to auscultation        Cardiovascular Rhythm:Regular Rate:Normal     Neuro/Psych    GI/Hepatic GERD-  Medicated and Controlled,  Endo/Other    Renal/GU      Musculoskeletal   Abdominal   Peds  Hematology   Anesthesia Other Findings   Reproductive/Obstetrics                             Anesthesia Physical Anesthesia Plan  ASA: II  Anesthesia Plan: General   Post-op Pain Management:    Induction: Intravenous  Airway Management Planned: LMA  Additional Equipment:   Intra-op Plan:   Post-operative Plan: Extubation in OR  Informed Consent: I have reviewed the patients History and Physical, chart, labs and discussed the procedure including the risks, benefits and alternatives for the proposed anesthesia with the patient or authorized representative who has indicated his/her understanding and acceptance.   Dental advisory given  Plan Discussed with: CRNA, Anesthesiologist and Surgeon  Anesthesia Plan Comments:         Anesthesia Quick Evaluation

## 2014-08-30 NOTE — Anesthesia Postprocedure Evaluation (Signed)
  Anesthesia Post-op Note  Patient: Daniel [name]  Procedure(s) Performed: Procedure(s): RIGHT CUBITAL TUNNEL RELEASE (Right) RIGHT ULNAR ARTERY RESECTION WITH VEIN GRAFT (Right)  Patient Location: PACU  Anesthesia Type: General with SCB for pain   Level of Consciousness: awake, alert  and oriented  Airway and Oxygen Therapy: Patient Spontanous Breathing   Post-op Pain: none  Post-op Assessment: Post-op Vital signs reviewed  Post-op Vital Signs: Reviewed  Last Vitals:  Filed Vitals:   08/30/14 1630  BP: 113/83  Pulse: 61  Temp:   Resp: 11    Complications: No apparent anesthesia complications

## 2014-08-30 NOTE — Transfer of Care (Signed)
Immediate Anesthesia Transfer of Care Note  Patient: Daniel [name]  Procedure(s) Performed: Procedure(s): RIGHT CUBITAL TUNNEL RELEASE (Right) RIGHT ULNAR ARTERY RESECTION WITH VEIN GRAFT (Right)  Patient Location: PACU  Anesthesia Type:General and Regional  Level of Consciousness: sedated  Airway & Oxygen Therapy: Patient Spontanous Breathing and Patient connected to face mask oxygen  Post-op Assessment: Report given to PACU RN and Post -op Vital signs reviewed and stable  Post vital signs: Reviewed and stable  Complications: No apparent anesthesia complications

## 2014-08-30 NOTE — Progress Notes (Signed)
Assisted Dr. Crews with right, ultrasound guided, supraclavicular block. Side rails up, monitors on throughout procedure. See vital signs in flow sheet. Tolerated Procedure well. 

## 2014-09-04 NOTE — Op Note (Signed)
NAME:  Daniel [name], Daniel [name] NO.:  [id number]  MEDICAL RECORD NO.:  [id number]  LOCATION:                               FACILITY:  [facility]  PHYSICIAN:  Sheral Apley. 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  PROCEDURE:  Right cubital tunnel release with exploration and resection of thrombosed ulnar artery with primary repair with reversed vein graft microscopically.  SURGEON:  Sheral Apley. Burney Gauze, MD, and Daryll Brod, MD.  ANESTHESIA:  Axillary block and general.  TOURNIQUET TIME:  Eighty three minutes.  COMPLICATION:  No complication.  DRAINS:  No drains.  The patient was taken to the operating suite after induction of general anesthetic.  The right upper extremity was prepped and draped in sterile fashion.  An Esmarch was used to exsanguinate the limb.  Tourniquet was inflated to __________150 mmHg.  At this point in time, an incision was made on the medial aspect of the right elbow centered between olecranon process and tip of the medial epicondyle.  Skin was incised sharply. Dissection was carried down to the cubital tunnel.  The cubital tunnel was carefully unroofed.  Ulnar nerve decompressed distally including release of the fascia along the flexor carpi ulnaris muscles, proximally under skin bridge, we released the intermuscular septum off the medial epicondyle and released all pressure points.  The nerve was stable in the groove.  It was loosely closed with 2-0 undyed Vicryl and a 3-0 Prolene subcuticular stitch on the skin.  __________ was made in the palmar aspect of the hand starting in the area of the ulnar artery and FCU tendon __________ to the  mid palmar level, flaps were raised. Dissection was carried down to the ulnar neurovascular bundle.  We carefully identified the thrombosed segment, traced it to the common digital takeoff to the small and ring fingers.  We then incised on the medial aspect of the forearm proximally where we previously marked several venous structures, carefully dissected down to a vein.  We then brought the microscope onto the field.  Under microscopic magnification, we resected the thrombosed section of the ulnar artery and sent it to pathologic confirmation.  We cleared all the remaining clot at the proximal and distal aspects.  We then used __________ solution to irrigate out any remaining debris and clot.  We next took a 2.5 cm vein graft from the previously mentioned incision on the proximal forearm medially.  We transected it.  We tied off the proximal and distal aspects and that wound was enclosed with 2-0 undyed Vicryl and a 3-0 Prolene subcuticular stitch.  We then reversed the vein graft, under microscopic magnification anastamosed both proximally and distally using 9-0 nylon.  We then dropped the tourniquet, waited for 5-1/2 to 6 minutes.  We then observed good flow distally.  No leaking at the anastomosis sites proximally and distally.  The wound was then thoroughly irrigated and loosely closed with 4-0 nylon.  Xeroform, 4x4s, fluffs, and a volar splint was applied to the wrist, compression bandage to the elbow. Patient tolerated all procedures well in concealed fashion.     Sheral Apley Burney Gauze, M.D.     MAW/MEDQ  D:  08/30/2014  T:  08/31/2014  Job:  [id number]

## 2014-10-18 ENCOUNTER — Encounter: Payer: Self-pay | Admitting: Internal Medicine

## 2014-12-11 ENCOUNTER — Ambulatory Visit (AMBULATORY_SURGERY_CENTER): Payer: Self-pay

## 2014-12-11 VITALS — Ht 74.0 in | Wt 189.0 lb

## 2014-12-11 DIAGNOSIS — Z1211 Encounter for screening for malignant neoplasm of colon: Secondary | ICD-10-CM

## 2014-12-11 MED ORDER — MOVIPREP 100 G PO SOLR: 1.0000 | Freq: Once | ORAL | Status: DC

## 2014-12-11 NOTE — Progress Notes (Signed)
No allergies to eggs or soy No diet/weight loss meds No past problems with anesthesia except PONV with general anesthesia No home oxygen  Has email  Emmi instructions given for colonoscopy

## 2014-12-25 ENCOUNTER — Encounter: Payer: Self-pay | Admitting: Internal Medicine

## 2014-12-25 ENCOUNTER — Ambulatory Visit (AMBULATORY_SURGERY_CENTER): Payer: 59 | Admitting: Internal Medicine

## 2014-12-25 VITALS — BP 100/70 | HR 60 | Temp 96.3°F | Resp 14 | Ht 74.0 in | Wt 189.0 lb

## 2014-12-25 DIAGNOSIS — Z1211 Encounter for screening for malignant neoplasm of colon: Secondary | ICD-10-CM

## 2014-12-25 DIAGNOSIS — D120 Benign neoplasm of cecum: Secondary | ICD-10-CM

## 2014-12-25 DIAGNOSIS — D125 Benign neoplasm of sigmoid colon: Secondary | ICD-10-CM

## 2014-12-25 DIAGNOSIS — D128 Benign neoplasm of rectum: Secondary | ICD-10-CM

## 2014-12-25 DIAGNOSIS — D127 Benign neoplasm of rectosigmoid junction: Secondary | ICD-10-CM

## 2014-12-25 MED ORDER — SODIUM CHLORIDE 0.9 % IV SOLN: 500.0000 mL | INTRAVENOUS | Status: DC

## 2014-12-25 NOTE — Progress Notes (Signed)
A/ox3 pleased with MAC, report to April RN 

## 2014-12-25 NOTE — Op Note (Signed)
[facility]  [address]. [place], [address]   COLONOSCOPY PROCEDURE REPORT  PATIENT: Daniel [name], Daniel [name]  MR#: [id number] BIRTHDATE: 1960/12/07 , 64  yrs. old GENDER: male ENDOSCOPIST: Jerene Bears, MD REFERRED XB:JYNW Virgina Jock, M.D. PROCEDURE DATE:  12/25/2014 PROCEDURE:   Colonoscopy with cold biopsy polypectomy and Colonoscopy with snare polypectomy First Screening Colonoscopy - Avg.  risk and is 50 yrs.  old or older Yes.  Prior Negative Screening - Now for repeat screening. N/A  History of Adenoma - Now for follow-up colonoscopy & has been > or = to 3 yrs.  N/A  Polyps Removed Today? Yes. ASA CLASS:   Class II INDICATIONS:average risk patient for colon cancer. MEDICATIONS: Monitored anesthesia care and Propofol 300 mg IV  DESCRIPTION OF PROCEDURE:   After the risks benefits and alternatives of the procedure were thoroughly explained, informed consent was obtained.  The digital rectal exam revealed no rectal mass.   The LB [id number]  endoscope was introduced through the anus and advanced to the cecum, which was identified by both the appendix and ileocecal valve. No adverse events experienced. The quality of the prep was good, using MoviPrep  The instrument was then slowly withdrawn as the colon was fully examined.   COLON FINDINGS: Two sessile polyps measuring 5 mm in size were found in the sigmoid colon and at the cecum.  Polypectomies were performed with a cold snare.  The resection was complete, the polyp tissue was completely retrieved and sent to histology.   A sessile polyp measuring 2 mm in size was found in the rectum.  A polypectomy was performed with cold forceps.  The resection was complete, the polyp tissue was completely retrieved and sent to histology.  Retroflexed views revealed internal hemorrhoids. The time to cecum=2 minutes 03 seconds.  Withdrawal time=12 minutes 53 seconds.  The scope was withdrawn and the procedure  completed. COMPLICATIONS: There were no immediate complications.  ENDOSCOPIC IMPRESSION: 1.   Two sessile polyps were found in the sigmoid colon and at the cecum; polypectomies were performed with a cold snare 2.   Sessile polyp was found in the rectum; polypectomy was performed with cold forceps  RECOMMENDATIONS: 1.  Await pathology results 2.  Add Benefiber 1 tablespoon with breakfast and dinner daily to help with mild constipation 3.  If the polyps removed today are proven to be adenomatous (pre-cancerous) polyps, you will need a repeat colonoscopy in 5 years.  Otherwise you should continue to follow colorectal cancer screening guidelines for "routine risk" patients with colonoscopy in 10 years.  You will receive a letter within 1-2 weeks with the results of your biopsy as well as final recommendations.  Please call my office if you have not received a letter after 3 weeks.  eSigned:  Jerene Bears, MD 12/25/2014 10:23 AM  cc: Shon Baton, MD and The Patient

## 2014-12-25 NOTE — Patient Instructions (Signed)
YOU HAD AN ENDOSCOPIC PROCEDURE TODAY AT THE [facility]: Refer to the procedure report that was given to you for any specific questions about what was found during the examination.  If the procedure report does not answer your questions, please call your gastroenterologist to clarify.  If you requested that your care partner not be given the details of your procedure findings, then the procedure report has been included in a sealed envelope for you to review at your convenience later.  YOU SHOULD EXPECT: Some feelings of bloating in the abdomen. Passage of more gas than usual.  Walking can help get rid of the air that was put into your GI tract during the procedure and reduce the bloating. If you had a lower endoscopy (such as a colonoscopy or flexible sigmoidoscopy) you may notice spotting of blood in your stool or on the toilet paper. If you underwent a bowel prep for your procedure, then you may not have a normal bowel movement for a few days.  DIET: Your first meal following the procedure should be a light meal and then it is ok to progress to your normal diet.  A half-sandwich or bowl of soup is an example of a good first meal.  Heavy or fried foods are harder to digest and may make you feel nauseous or bloated.  Likewise meals heavy in dairy and vegetables can cause extra gas to form and this can also increase the bloating.  Drink plenty of fluids but you should avoid alcoholic beverages for 24 hours.  ACTIVITY: Your care partner should take you home directly after the procedure.  You should plan to take it easy, moving slowly for the rest of the day.  You can resume normal activity the day after the procedure however you should NOT DRIVE or use heavy machinery for 24 hours (because of the sedation medicines used during the test).    SYMPTOMS TO REPORT IMMEDIATELY: A gastroenterologist can be reached at any hour.  During normal business hours, 8:30 AM to 5:00 PM Monday through Friday,  call [phone].  After hours and on weekends, please call the GI answering service at [phone] who will take a message and have the physician on call contact you.   Following lower endoscopy (colonoscopy or flexible sigmoidoscopy):  Excessive amounts of blood in the stool  Significant tenderness or worsening of abdominal pains  Swelling of the abdomen that is new, acute  Fever of 100F or higher  FOLLOW UP: If any biopsies were taken you will be contacted by phone or by letter within the next 1-3 weeks.  Call your gastroenterologist if you have not heard about the biopsies in 3 weeks.  Our staff will call the home number listed on your records the next business day following your procedure to check on you and address any questions or concerns that you may have at that time regarding the information given to you following your procedure. This is a courtesy call and so if there is no answer at the home number and we have not heard from you through the emergency physician on call, we will assume that you have returned to your regular daily activities without incident.  SIGNATURES/CONFIDENTIALITY: You and/or your care partner have signed paperwork which will be entered into your electronic medical record.  These signatures attest to the fact that that the information above on your After Visit Summary has been reviewed and is understood.  Full responsibility of the confidentiality of this   discharge information lies with you and/or your care-partner.  Recommendations Next colonoscopy determined by pathology results. Discharge instructions given to patient and/or care partner. Polyp handout provided. 1 tablespoon of Benefiber with breakfast and dinner daily for constipation.

## 2014-12-25 NOTE — Progress Notes (Signed)
Called to room to assist during endoscopic procedure.  Patient ID and intended procedure confirmed with present staff. Received instructions for my participation in the procedure from the performing physician.  

## 2014-12-26 ENCOUNTER — Telehealth: Payer: Self-pay | Admitting: *Deleted

## 2014-12-26 NOTE — Telephone Encounter (Signed)
  Follow up Call-  Call back number 12/25/2014  Post procedure Call Back phone  # [phone]  Permission to leave phone message Yes     Patient questions:  Do you have a fever, pain , or abdominal swelling? Yes.   Pain Score  0 *  Have you tolerated food without any problems? Yes.    Have you been able to return to your normal activities? Yes.    Do you have any questions about your discharge instructions: Diet   No. Medications  No. Follow up visit  No.  Do you have questions or concerns about your Care? No.  Actions: * If pain score is 4 or above: No action needed, pain <4.

## 2014-12-30 ENCOUNTER — Encounter: Payer: Self-pay | Admitting: Internal Medicine

## 2017-04-15 ENCOUNTER — Encounter (HOSPITAL_COMMUNITY)
Admission: RE | Admit: 2017-04-15 | Discharge: 2017-04-15 | Disposition: A | Discharge status: Home / Self Care | Payer: PRIVATE HEALTH INSURANCE | Source: Ambulatory Visit | Attending: Orthopedic Surgery | Admitting: Orthopedic Surgery

## 2017-04-15 ENCOUNTER — Other Ambulatory Visit: Payer: Self-pay | Admitting: Orthopedic Surgery

## 2017-04-15 ENCOUNTER — Encounter (HOSPITAL_COMMUNITY): Payer: Self-pay

## 2017-04-15 DIAGNOSIS — Z01810 Encounter for preprocedural cardiovascular examination: Secondary | ICD-10-CM | POA: Insufficient documentation

## 2017-04-15 DIAGNOSIS — R001 Bradycardia, unspecified: Secondary | ICD-10-CM | POA: Diagnosis not present

## 2017-04-15 DIAGNOSIS — Z01818 Encounter for other preprocedural examination: Secondary | ICD-10-CM | POA: Diagnosis present

## 2017-04-15 LAB — CBC
HEMATOCRIT: 46.8 % (ref 39.0–52.0)
HEMOGLOBIN: 15.9 g/dL (ref 13.0–17.0)
MCH: 32.1 pg (ref 26.0–34.0)
MCHC: 34 g/dL (ref 30.0–36.0)
MCV: 94.4 fL (ref 78.0–100.0)
Platelets: 259 10*3/uL (ref 150–400)
RBC: 4.96 MIL/uL (ref 4.22–5.81)
RDW: 13.8 % (ref 11.5–15.5)
WBC: 6.3 10*3/uL (ref 4.0–10.5)

## 2017-04-15 LAB — BASIC METABOLIC PANEL
Anion gap: 9 (ref 5–15)
BUN: 11 mg/dL (ref 6–20)
CHLORIDE: 103 mmol/L (ref 101–111)
CO2: 24 mmol/L (ref 22–32)
Calcium: 9.2 mg/dL (ref 8.9–10.3)
Creatinine, Ser: 0.97 mg/dL (ref 0.61–1.24)
GFR calc Af Amer: >60 mL/min (ref >60)
GFR calc non Af Amer: >60 mL/min (ref >60)
GLUCOSE: 76 mg/dL (ref 65–99)
POTASSIUM: 3.8 mmol/L (ref 3.5–5.1)
Sodium: 136 mmol/L (ref 135–145)

## 2017-04-15 LAB — SURGICAL PCR SCREEN
MRSA, PCR: NEGATIVE
Staphylococcus aureus: NEGATIVE

## 2017-04-15 NOTE — Pre-Procedure Instructions (Addendum)
    Daniel [name]  04/15/2017      [facility] - [place] - [address] [address] [place] [address] Phone: [phone] Fax: [phone]    Your procedure is scheduled on June 26.  Report to [facility] Admitting at 5:30 A.M.  Call this number if you have problems the morning of surgery:  [phone]   Remember:  Do not eat food or drink liquids after midnight.  Take these medicines the morning of surgery with A SIP OF WATER : citalopram (CELEXA), lansoprazole (PREVACID)   STOP aspirin, herbal medications, vitamins, NSAIDS ie: advil, aleve, ibuprofen 04/19/17   Do not wear jewelry, make-up or nail polish.  Do not wear lotions, powders, or perfumes, or deoderant.  Do not shave 48 hours prior to surgery.  Men may shave face and neck.  Do not bring valuables to the hospital.  [facility] is not responsible for any belongings or valuables.  Contacts, dentures or bridgework may not be worn into surgery.  Leave your suitcase in the car.  After surgery it may be brought to your room.  For patients admitted to the hospital, discharge time will be determined by your treatment team.  Patients discharged the day of surgery will not be allowed to drive home.   Name and phone number of your driver:    Special instructions:  Preparing for surgery  Please read over the following fact sheets that you were given. Pain Booklet, Total Joint Packet and Surgical Site Infection Prevention

## 2017-04-15 NOTE — Progress Notes (Addendum)
No presurgical orders called and was put on hold with no return, left voicemail at [phone] for surgical scheduler, PAT today at 0900

## 2017-04-25 NOTE — Anesthesia Preprocedure Evaluation (Addendum)
Anesthesia Evaluation  Patient identified by MRN, date of birth, ID band Patient awake    Reviewed: Allergy & Precautions, H&P , NPO status , Patient's Chart, lab work & pertinent test results  History of Anesthesia Complications (+) PONV  Airway Mallampati: I  TM Distance: >3 FB Neck ROM: Full    Dental no notable dental hx. (+) Teeth Intact, Dental Advisory Given   Pulmonary neg pulmonary ROS, former smoker,    Pulmonary exam normal breath sounds clear to auscultation       Cardiovascular negative cardio ROS Normal cardiovascular exam Rhythm:Regular Rate:Normal     Neuro/Psych negative neurological ROS  negative psych ROS   GI/Hepatic negative GI ROS, Neg liver ROS, GERD  Medicated and Controlled,  Endo/Other  negative endocrine ROS  Renal/GU negative Renal ROS  negative genitourinary   Musculoskeletal negative musculoskeletal ROS (+)   Abdominal   Peds negative pediatric ROS (+)  Hematology negative hematology ROS (+)   Anesthesia Other Findings Day of surgery medications reviewed with the patient.  Reproductive/Obstetrics negative OB ROS                             Anesthesia Physical  Anesthesia Plan  ASA: II  Anesthesia Plan: Spinal   Post-op Pain Management:    Induction: Intravenous  PONV Risk Score and Plan: 2 and Ondansetron, Dexamethasone and Scopolamine patch - Pre-op  Airway Management Planned: Nasal Cannula  Additional Equipment:   Intra-op Plan:   Post-operative Plan: Extubation in OR  Informed Consent: I have reviewed the patients History and Physical, chart, labs and discussed the procedure including the risks, benefits and alternatives for the proposed anesthesia with the patient or authorized representative who has indicated his/her understanding and acceptance.   Dental advisory given  Plan Discussed with: CRNA, Anesthesiologist and  Surgeon  Anesthesia Plan Comments: (  )        Anesthesia Quick Evaluation

## 2017-04-26 ENCOUNTER — Inpatient Hospital Stay (HOSPITAL_COMMUNITY)
Admission: RE | Admit: 2017-04-26 | Discharge: 2017-04-29 | DRG: 470 | Disposition: A | Discharge status: Home / Self Care | Payer: PRIVATE HEALTH INSURANCE | Source: Ambulatory Visit | Attending: Orthopedic Surgery | Admitting: Orthopedic Surgery

## 2017-04-26 ENCOUNTER — Encounter (HOSPITAL_COMMUNITY): Payer: Self-pay | Admitting: Certified Registered Nurse Anesthetist

## 2017-04-26 ENCOUNTER — Encounter (HOSPITAL_COMMUNITY)
Admission: RE | Disposition: A | Discharge status: Home / Self Care | Payer: Self-pay | Source: Ambulatory Visit | Attending: Orthopedic Surgery

## 2017-04-26 ENCOUNTER — Inpatient Hospital Stay (HOSPITAL_COMMUNITY): Payer: PRIVATE HEALTH INSURANCE

## 2017-04-26 ENCOUNTER — Inpatient Hospital Stay (HOSPITAL_COMMUNITY): Payer: PRIVATE HEALTH INSURANCE | Admitting: Anesthesiology

## 2017-04-26 DIAGNOSIS — Z7982 Long term (current) use of aspirin: Secondary | ICD-10-CM | POA: Diagnosis not present

## 2017-04-26 DIAGNOSIS — Z87891 Personal history of nicotine dependence: Secondary | ICD-10-CM

## 2017-04-26 DIAGNOSIS — Z79899 Other long term (current) drug therapy: Secondary | ICD-10-CM | POA: Diagnosis not present

## 2017-04-26 DIAGNOSIS — M879 Osteonecrosis, unspecified: Principal | ICD-10-CM | POA: Diagnosis present

## 2017-04-26 DIAGNOSIS — Z96649 Presence of unspecified artificial hip joint: Secondary | ICD-10-CM

## 2017-04-26 DIAGNOSIS — K219 Gastro-esophageal reflux disease without esophagitis: Secondary | ICD-10-CM | POA: Diagnosis present

## 2017-04-26 DIAGNOSIS — M25552 Pain in left hip: Secondary | ICD-10-CM | POA: Diagnosis present

## 2017-04-26 DIAGNOSIS — M87052 Idiopathic aseptic necrosis of left femur: Secondary | ICD-10-CM | POA: Diagnosis present

## 2017-04-26 DIAGNOSIS — F419 Anxiety disorder, unspecified: Secondary | ICD-10-CM | POA: Diagnosis present

## 2017-04-26 DIAGNOSIS — M1610 Unilateral primary osteoarthritis, unspecified hip: Secondary | ICD-10-CM

## 2017-04-26 HISTORY — DX: Idiopathic aseptic necrosis of left femur: M87.052

## 2017-04-26 HISTORY — PX: TOTAL HIP ARTHROPLASTY: SHX124

## 2017-04-26 IMAGING — DX DG PORTABLE PELVIS
1 series · 1 of 1 positions shown · non-contrast
Comparison: [DATE]

CLINICAL DATA: Left total hip arthroplasty

EXAM:
PORTABLE PELVIS 1-2 VIEWS

[pelvis ap]
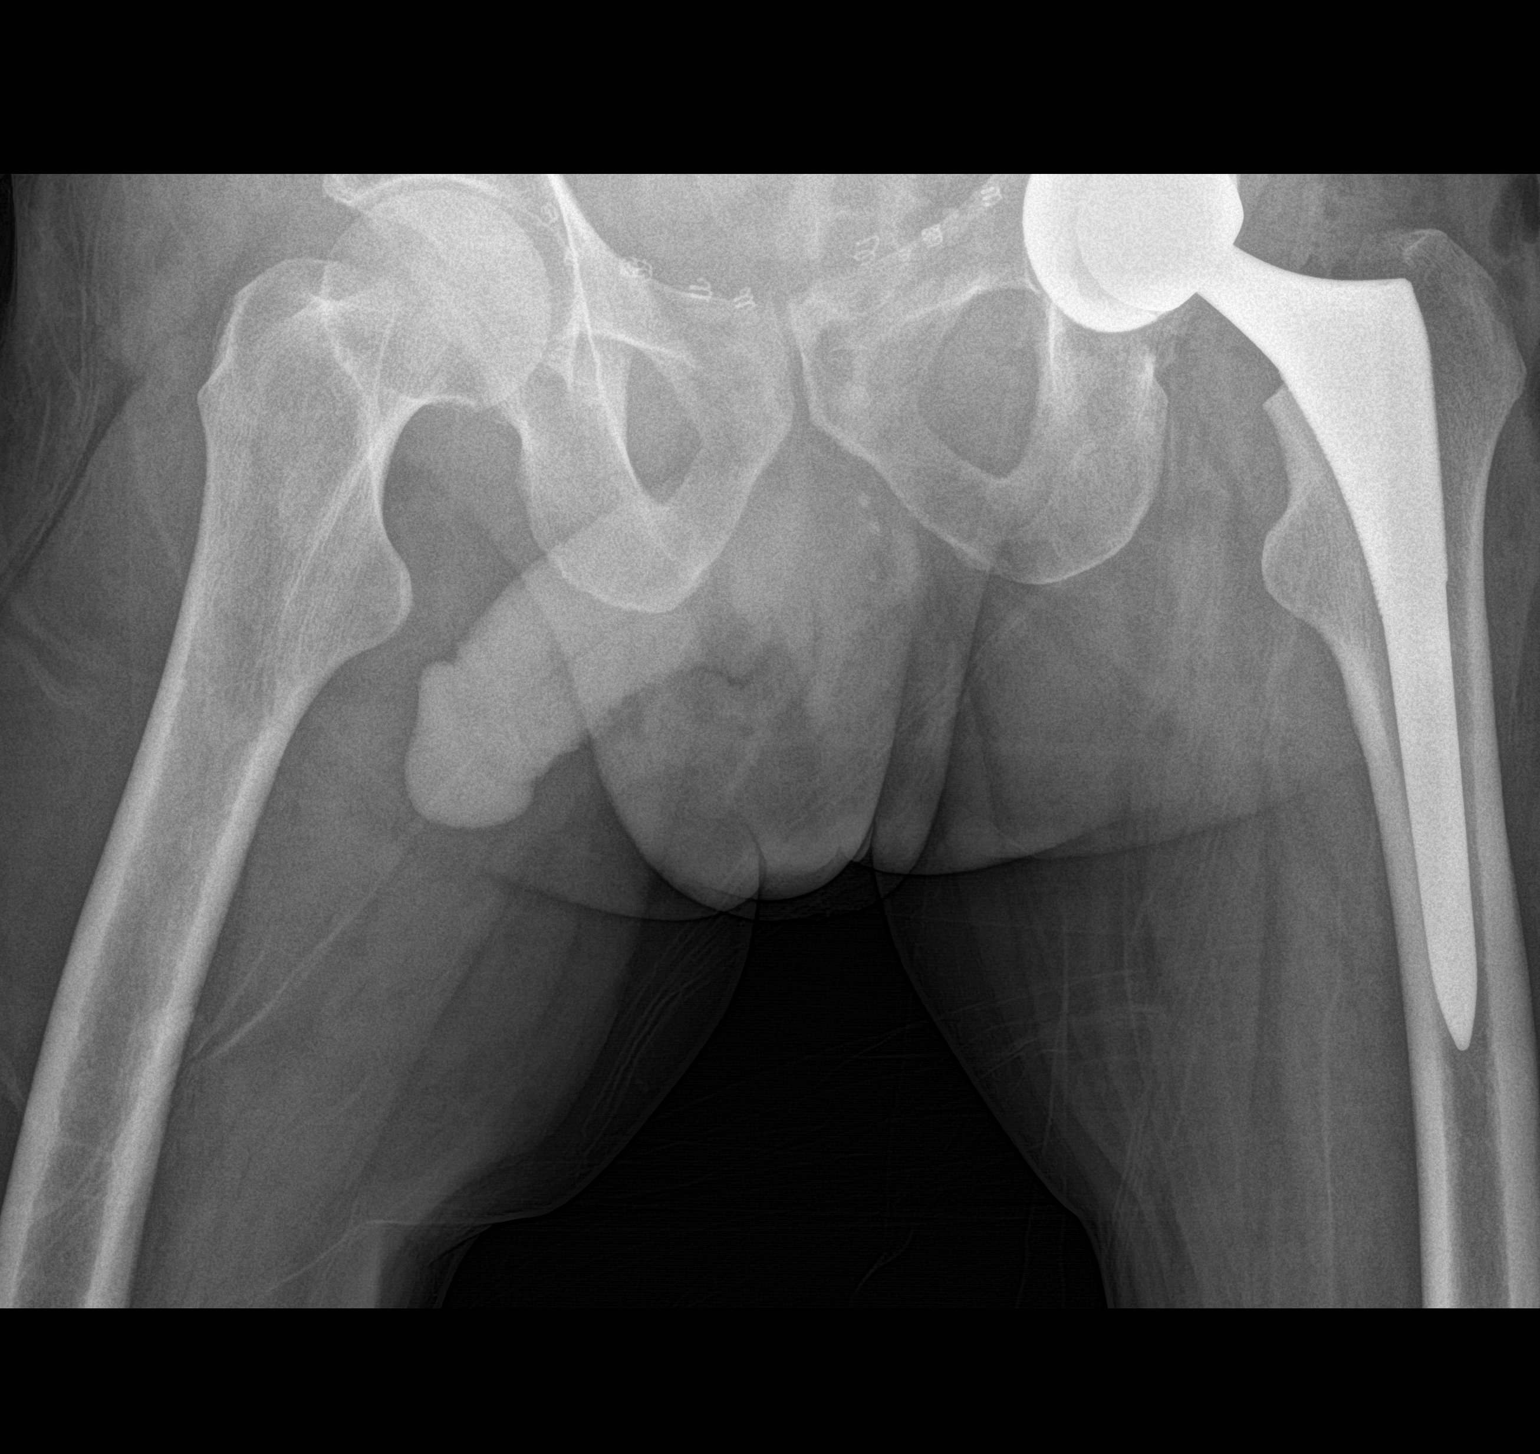

[1 of 1 positions shown; findings below may reference images not displayed]

FINDINGS: Left total hip arthroplasty has been placed. No breakage or
loosening of the hardware. Anatomic alignment.
IMPRESSION: Left total hip arthroplasty anatomically aligned.

## 2017-04-26 IMAGING — DX DG HIP (WITH OR WITHOUT PELVIS) 1V PORT*L*
1 series · 1 of 1 positions shown · non-contrast
Comparison: None.

CLINICAL DATA: Left total hip arthroplasty.

EXAM:
DG HIP (WITH OR WITHOUT PELVIS) 1V PORT LEFT

[hip lat]
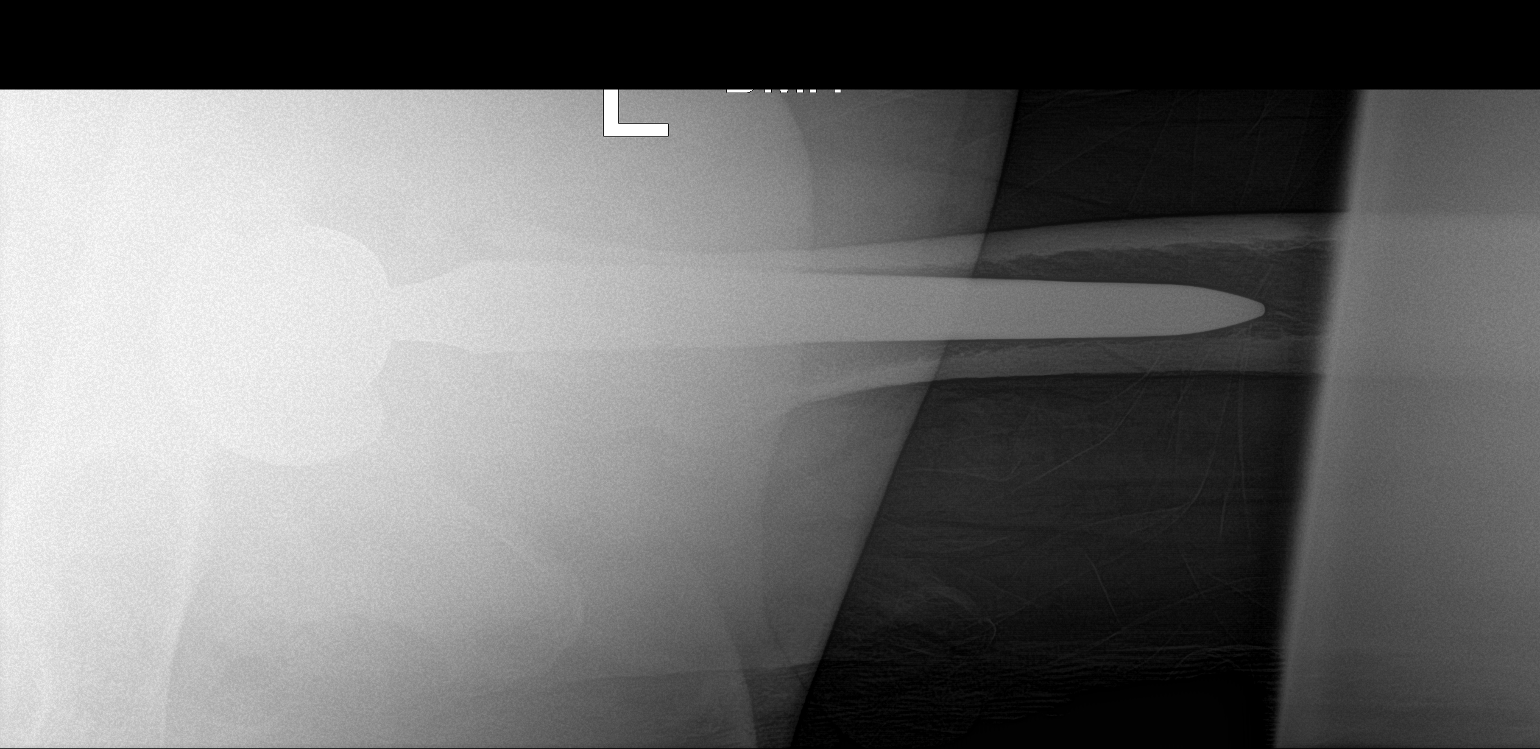

[1 of 1 positions shown; findings below may reference images not displayed]

FINDINGS: Lateral portable view demonstrates that the components of the total
hip prosthesis appear in excellent position in the lateral
projection. No fracture.
IMPRESSION: Satisfactory appearance of the left hip in the lateral projection
after total hip prosthesis insertion.

## 2017-04-26 SURGERY — ARTHROPLASTY, HIP, TOTAL,POSTERIOR APPROACH
Anesthesia: Spinal | Site: Hip | Laterality: Left

## 2017-04-26 MED ORDER — METHOCARBAMOL 1000 MG/10ML IJ SOLN: 500.0000 mg | Freq: Four times a day (QID) | INTRAMUSCULAR | Status: DC | PRN

## 2017-04-26 MED ORDER — FENTANYL CITRATE (PF) 100 MCG/2ML IJ SOLN
25.0000 ug | INTRAMUSCULAR | Status: DC | PRN
  Administered 2017-04-26: 25 ug via INTRAVENOUS
  Administered 2017-04-26: 50 ug via INTRAVENOUS

## 2017-04-26 MED ORDER — SCOPOLAMINE 1 MG/3DAYS TD PT72
1.0000 | MEDICATED_PATCH | Freq: Once | TRANSDERMAL | Status: AC
  Administered 2017-04-26: 1.5 mg via TRANSDERMAL
  Filled 2017-04-26 (×2): qty 1

## 2017-04-26 MED ORDER — MAGNESIUM CITRATE PO SOLN: 1.0000 | Freq: Once | ORAL | Status: DC | PRN

## 2017-04-26 MED ORDER — METHOCARBAMOL 500 MG PO TABS
ORAL_TABLET | ORAL | Status: AC
  Filled 2017-04-26: qty 1

## 2017-04-26 MED ORDER — GABAPENTIN 300 MG PO CAPS
300.0000 mg | ORAL_CAPSULE | Freq: Three times a day (TID) | ORAL | Status: DC
  Administered 2017-04-26 – 2017-04-29 (×9): 300 mg via ORAL
  Filled 2017-04-26 (×9): qty 1

## 2017-04-26 MED ORDER — POTASSIUM CHLORIDE IN NACL 20-0.45 MEQ/L-% IV SOLN
INTRAVENOUS | Status: DC
  Administered 2017-04-26 – 2017-04-27 (×2): via INTRAVENOUS
  Filled 2017-04-26 (×3): qty 1000

## 2017-04-26 MED ORDER — ONDANSETRON HCL 4 MG/2ML IJ SOLN
INTRAMUSCULAR | Status: AC
  Filled 2017-04-26: qty 2

## 2017-04-26 MED ORDER — ACETAMINOPHEN 650 MG RE SUPP: 650.0000 mg | Freq: Four times a day (QID) | RECTAL | Status: DC | PRN

## 2017-04-26 MED ORDER — PROPOFOL 500 MG/50ML IV EMUL
INTRAVENOUS | Status: DC | PRN
  Administered 2017-04-26: 10:00:00 via INTRAVENOUS
  Administered 2017-04-26: 100 ug/kg/min via INTRAVENOUS

## 2017-04-26 MED ORDER — EPHEDRINE 5 MG/ML INJ
INTRAVENOUS | Status: AC
  Filled 2017-04-26: qty 10

## 2017-04-26 MED ORDER — BACLOFEN 10 MG PO TABS: 10.0000 mg | ORAL_TABLET | Freq: Three times a day (TID) | ORAL | 0 refills | Status: DC

## 2017-04-26 MED ORDER — FENTANYL CITRATE (PF) 100 MCG/2ML IJ SOLN
INTRAMUSCULAR | Status: DC | PRN
  Administered 2017-04-26 (×4): 50 ug via INTRAVENOUS

## 2017-04-26 MED ORDER — ONDANSETRON HCL 4 MG/2ML IJ SOLN: 4.0000 mg | Freq: Once | INTRAMUSCULAR | Status: DC | PRN

## 2017-04-26 MED ORDER — SENNA-DOCUSATE SODIUM 8.6-50 MG PO TABS: 2.0000 | ORAL_TABLET | Freq: Every day | ORAL | 1 refills | Status: DC

## 2017-04-26 MED ORDER — THIAMINE HCL 100 MG/ML IJ SOLN
100.0000 mg | Freq: Every day | INTRAMUSCULAR | Status: DC
  Filled 2017-04-26: qty 2

## 2017-04-26 MED ORDER — ALUM & MAG HYDROXIDE-SIMETH 200-200-20 MG/5ML PO SUSP: 30.0000 mL | ORAL | Status: DC | PRN

## 2017-04-26 MED ORDER — RIVAROXABAN 10 MG PO TABS
10.0000 mg | ORAL_TABLET | Freq: Every day | ORAL | Status: DC
  Administered 2017-04-27 – 2017-04-29 (×3): 10 mg via ORAL
  Filled 2017-04-26 (×3): qty 1

## 2017-04-26 MED ORDER — MIDAZOLAM HCL 2 MG/2ML IJ SOLN
INTRAMUSCULAR | Status: AC
  Filled 2017-04-26: qty 2

## 2017-04-26 MED ORDER — LIDOCAINE 2% (20 MG/ML) 5 ML SYRINGE
INTRAMUSCULAR | Status: AC
  Filled 2017-04-26: qty 5

## 2017-04-26 MED ORDER — OXYCODONE HCL 5 MG PO TABS
ORAL_TABLET | ORAL | Status: AC
  Filled 2017-04-26: qty 1

## 2017-04-26 MED ORDER — LORAZEPAM 2 MG/ML IJ SOLN: 1.0000 mg | Freq: Four times a day (QID) | INTRAMUSCULAR | Status: DC | PRN

## 2017-04-26 MED ORDER — ADULT MULTIVITAMIN W/MINERALS CH
1.0000 | ORAL_TABLET | Freq: Every day | ORAL | Status: DC
  Administered 2017-04-26 – 2017-04-29 (×4): 1 via ORAL
  Filled 2017-04-26 (×4): qty 1

## 2017-04-26 MED ORDER — ONDANSETRON HCL 4 MG PO TABS: 4.0000 mg | ORAL_TABLET | Freq: Three times a day (TID) | ORAL | 0 refills | Status: DC | PRN

## 2017-04-26 MED ORDER — LACTATED RINGERS IV SOLN
INTRAVENOUS | Status: DC | PRN
  Administered 2017-04-26 (×2): via INTRAVENOUS

## 2017-04-26 MED ORDER — PHENYLEPHRINE 40 MCG/ML (10ML) SYRINGE FOR IV PUSH (FOR BLOOD PRESSURE SUPPORT)
PREFILLED_SYRINGE | INTRAVENOUS | Status: DC | PRN
  Administered 2017-04-26 (×2): 40 ug via INTRAVENOUS
  Administered 2017-04-26 (×2): 80 ug via INTRAVENOUS
  Administered 2017-04-26: 40 ug via INTRAVENOUS

## 2017-04-26 MED ORDER — ONDANSETRON HCL 4 MG PO TABS: 4.0000 mg | ORAL_TABLET | Freq: Four times a day (QID) | ORAL | Status: DC | PRN

## 2017-04-26 MED ORDER — BUPIVACAINE IN DEXTROSE 0.75-8.25 % IT SOLN
INTRATHECAL | Status: DC | PRN
  Administered 2017-04-26: 2 mL via INTRATHECAL

## 2017-04-26 MED ORDER — HYDROMORPHONE HCL 1 MG/ML IJ SOLN
0.5000 mg | INTRAMUSCULAR | Status: DC | PRN
  Administered 2017-04-26 – 2017-04-27 (×3): 0.5 mg via INTRAVENOUS
  Filled 2017-04-26 (×2): qty 1

## 2017-04-26 MED ORDER — BISACODYL 10 MG RE SUPP: 10.0000 mg | Freq: Every day | RECTAL | Status: DC | PRN

## 2017-04-26 MED ORDER — RIVAROXABAN 10 MG PO TABS: 10.0000 mg | ORAL_TABLET | Freq: Every day | ORAL | 0 refills | Status: DC

## 2017-04-26 MED ORDER — METOCLOPRAMIDE HCL 5 MG PO TABS: 5.0000 mg | ORAL_TABLET | Freq: Three times a day (TID) | ORAL | Status: DC | PRN

## 2017-04-26 MED ORDER — FENTANYL CITRATE (PF) 100 MCG/2ML IJ SOLN
INTRAMUSCULAR | Status: AC
  Filled 2017-04-26: qty 2

## 2017-04-26 MED ORDER — SENNA 8.6 MG PO TABS
1.0000 | ORAL_TABLET | Freq: Two times a day (BID) | ORAL | Status: DC
  Administered 2017-04-26 – 2017-04-29 (×6): 8.6 mg via ORAL
  Filled 2017-04-26 (×6): qty 1

## 2017-04-26 MED ORDER — ONDANSETRON HCL 4 MG/2ML IJ SOLN: 4.0000 mg | Freq: Four times a day (QID) | INTRAMUSCULAR | Status: DC | PRN

## 2017-04-26 MED ORDER — MEPERIDINE HCL 25 MG/ML IJ SOLN: 6.2500 mg | INTRAMUSCULAR | Status: DC | PRN

## 2017-04-26 MED ORDER — CEFAZOLIN SODIUM-DEXTROSE 2-4 GM/100ML-% IV SOLN
2.0000 g | Freq: Four times a day (QID) | INTRAVENOUS | Status: AC
  Administered 2017-04-26 (×2): 2 g via INTRAVENOUS
  Filled 2017-04-26 (×2): qty 100

## 2017-04-26 MED ORDER — ONDANSETRON HCL 4 MG/2ML IJ SOLN
INTRAMUSCULAR | Status: DC | PRN
  Administered 2017-04-26: 4 mg via INTRAVENOUS

## 2017-04-26 MED ORDER — CEFAZOLIN SODIUM-DEXTROSE 2-4 GM/100ML-% IV SOLN
2.0000 g | INTRAVENOUS | Status: AC
  Administered 2017-04-26: 2 g via INTRAVENOUS

## 2017-04-26 MED ORDER — 0.9 % SODIUM CHLORIDE (POUR BTL) OPTIME
TOPICAL | Status: DC | PRN
  Administered 2017-04-26: 1000 mL

## 2017-04-26 MED ORDER — ZOLPIDEM TARTRATE 5 MG PO TABS: 5.0000 mg | ORAL_TABLET | Freq: Every evening | ORAL | Status: DC | PRN

## 2017-04-26 MED ORDER — HYDROMORPHONE HCL 1 MG/ML IJ SOLN
INTRAMUSCULAR | Status: AC
  Filled 2017-04-26: qty 0.5

## 2017-04-26 MED ORDER — OXYCODONE HCL 5 MG PO TABS: 5.0000 mg | ORAL_TABLET | ORAL | 0 refills | Status: DC | PRN

## 2017-04-26 MED ORDER — METOCLOPRAMIDE HCL 5 MG/ML IJ SOLN: 5.0000 mg | Freq: Three times a day (TID) | INTRAMUSCULAR | Status: DC | PRN

## 2017-04-26 MED ORDER — PHENYLEPHRINE HCL 10 MG/ML IJ SOLN
INTRAVENOUS | Status: DC | PRN
  Administered 2017-04-26: 20 ug/min via INTRAVENOUS

## 2017-04-26 MED ORDER — CEFAZOLIN SODIUM-DEXTROSE 2-4 GM/100ML-% IV SOLN
INTRAVENOUS | Status: AC
  Filled 2017-04-26: qty 100

## 2017-04-26 MED ORDER — PHENOL 1.4 % MT LIQD: 1.0000 | OROMUCOSAL | Status: DC | PRN

## 2017-04-26 MED ORDER — CITALOPRAM HYDROBROMIDE 40 MG PO TABS
40.0000 mg | ORAL_TABLET | Freq: Every day | ORAL | Status: DC
  Administered 2017-04-26 – 2017-04-28 (×3): 40 mg via ORAL
  Filled 2017-04-26 (×3): qty 1

## 2017-04-26 MED ORDER — POLYETHYLENE GLYCOL 3350 17 G PO PACK: 17.0000 g | PACK | Freq: Every day | ORAL | Status: DC | PRN

## 2017-04-26 MED ORDER — DOCUSATE SODIUM 100 MG PO CAPS
100.0000 mg | ORAL_CAPSULE | Freq: Two times a day (BID) | ORAL | Status: DC
  Administered 2017-04-26 – 2017-04-29 (×7): 100 mg via ORAL
  Filled 2017-04-26 (×7): qty 1

## 2017-04-26 MED ORDER — FOLIC ACID 1 MG PO TABS
1.0000 mg | ORAL_TABLET | Freq: Every day | ORAL | Status: DC
  Administered 2017-04-26 – 2017-04-29 (×4): 1 mg via ORAL
  Filled 2017-04-26 (×4): qty 1

## 2017-04-26 MED ORDER — FENTANYL CITRATE (PF) 250 MCG/5ML IJ SOLN
INTRAMUSCULAR | Status: AC
  Filled 2017-04-26: qty 5

## 2017-04-26 MED ORDER — PROPOFOL 10 MG/ML IV BOLUS
INTRAVENOUS | Status: AC
  Filled 2017-04-26: qty 20

## 2017-04-26 MED ORDER — BUPIVACAINE HCL (PF) 0.25 % IJ SOLN
INTRAMUSCULAR | Status: AC
  Filled 2017-04-26: qty 30

## 2017-04-26 MED ORDER — ACETAMINOPHEN 325 MG PO TABS: 650.0000 mg | ORAL_TABLET | Freq: Four times a day (QID) | ORAL | Status: DC | PRN

## 2017-04-26 MED ORDER — PANTOPRAZOLE SODIUM 40 MG PO TBEC
40.0000 mg | DELAYED_RELEASE_TABLET | Freq: Every day | ORAL | Status: DC
  Administered 2017-04-27 – 2017-04-29 (×3): 40 mg via ORAL
  Filled 2017-04-26 (×3): qty 1

## 2017-04-26 MED ORDER — DIPHENHYDRAMINE HCL 12.5 MG/5ML PO ELIX: 12.5000 mg | ORAL_SOLUTION | ORAL | Status: DC | PRN

## 2017-04-26 MED ORDER — METHOCARBAMOL 500 MG PO TABS
500.0000 mg | ORAL_TABLET | Freq: Four times a day (QID) | ORAL | Status: DC | PRN
  Administered 2017-04-26 – 2017-04-29 (×6): 500 mg via ORAL
  Filled 2017-04-26 (×6): qty 1

## 2017-04-26 MED ORDER — PHENYLEPHRINE 40 MCG/ML (10ML) SYRINGE FOR IV PUSH (FOR BLOOD PRESSURE SUPPORT)
PREFILLED_SYRINGE | INTRAVENOUS | Status: AC
  Filled 2017-04-26: qty 10

## 2017-04-26 MED ORDER — DEXAMETHASONE SODIUM PHOSPHATE 10 MG/ML IJ SOLN
10.0000 mg | Freq: Once | INTRAMUSCULAR | Status: AC
  Administered 2017-04-27: 10 mg via INTRAVENOUS
  Filled 2017-04-26: qty 1

## 2017-04-26 MED ORDER — FENTANYL CITRATE (PF) 100 MCG/2ML IJ SOLN
25.0000 ug | INTRAMUSCULAR | Status: DC | PRN
  Administered 2017-04-26: 50 ug via INTRAVENOUS
  Administered 2017-04-26: 25 ug via INTRAVENOUS

## 2017-04-26 MED ORDER — MIDAZOLAM HCL 2 MG/2ML IJ SOLN
INTRAMUSCULAR | Status: DC | PRN
  Administered 2017-04-26: 2 mg via INTRAVENOUS

## 2017-04-26 MED ORDER — VITAMIN B-1 100 MG PO TABS
100.0000 mg | ORAL_TABLET | Freq: Every day | ORAL | Status: DC
  Administered 2017-04-26 – 2017-04-29 (×4): 100 mg via ORAL
  Filled 2017-04-26 (×4): qty 1

## 2017-04-26 MED ORDER — EPHEDRINE SULFATE-NACL 50-0.9 MG/10ML-% IV SOSY
PREFILLED_SYRINGE | INTRAVENOUS | Status: DC | PRN
  Administered 2017-04-26 (×3): 10 mg via INTRAVENOUS
  Administered 2017-04-26: 5 mg via INTRAVENOUS

## 2017-04-26 MED ORDER — LORAZEPAM 1 MG PO TABS: 1.0000 mg | ORAL_TABLET | Freq: Four times a day (QID) | ORAL | Status: DC | PRN

## 2017-04-26 MED ORDER — MENTHOL 3 MG MT LOZG: 1.0000 | LOZENGE | OROMUCOSAL | Status: DC | PRN

## 2017-04-26 MED ORDER — OXYCODONE HCL 5 MG PO TABS
5.0000 mg | ORAL_TABLET | ORAL | Status: DC | PRN
  Administered 2017-04-26: 5 mg via ORAL
  Administered 2017-04-26 (×2): 10 mg via ORAL
  Administered 2017-04-26: 5 mg via ORAL
  Administered 2017-04-26 – 2017-04-27 (×6): 10 mg via ORAL
  Administered 2017-04-27: 5 mg via ORAL
  Administered 2017-04-28 – 2017-04-29 (×5): 10 mg via ORAL
  Filled 2017-04-26 (×14): qty 2

## 2017-04-26 SURGICAL SUPPLY — 52 items
BIT DRILL 5/64X5 DISP (BIT) ×3 IMPLANT
BLADE SAW SAG 73X25 THK (BLADE) ×2
BLADE SAW SGTL 73X25 THK (BLADE) ×1 IMPLANT
CAPT HIP TOTAL 2 ×3 IMPLANT
CLOSURE STERI-STRIP 1/2X4 (GAUZE/BANDAGES/DRESSINGS) ×1
CLSR STERI-STRIP ANTIMIC 1/2X4 (GAUZE/BANDAGES/DRESSINGS) ×2 IMPLANT
COVER SURGICAL LIGHT HANDLE (MISCELLANEOUS) ×3 IMPLANT
DRAPE INCISE IOBAN 66X45 STRL (DRAPES) ×3 IMPLANT
DRAPE ORTHO SPLIT 77X108 STRL (DRAPES) ×6
DRAPE SURG ORHT 6 SPLT 77X108 (DRAPES) ×2 IMPLANT
DRAPE U-SHAPE 47X51 STRL (DRAPES) ×3 IMPLANT
DRSG MEPILEX BORDER 4X12 (GAUZE/BANDAGES/DRESSINGS) IMPLANT
DRSG MEPILEX BORDER 4X8 (GAUZE/BANDAGES/DRESSINGS) ×3 IMPLANT
DURAPREP 26ML APPLICATOR (WOUND CARE) ×3 IMPLANT
ELECT BLADE 4.0 EZ CLEAN MEGAD (MISCELLANEOUS) ×3
ELECT CAUTERY BLADE 6.4 (BLADE) ×3 IMPLANT
ELECT REM PT RETURN 9FT ADLT (ELECTROSURGICAL) ×3
ELECTRODE BLDE 4.0 EZ CLN MEGD (MISCELLANEOUS) ×1 IMPLANT
ELECTRODE REM PT RTRN 9FT ADLT (ELECTROSURGICAL) ×1 IMPLANT
GLOVE BIOGEL PI ORTHO PRO SZ8 (GLOVE) ×4
GLOVE ORTHO TXT STRL SZ7.5 (GLOVE) ×3 IMPLANT
GLOVE PI ORTHO PRO STRL SZ8 (GLOVE) ×2 IMPLANT
GLOVE SURG ORTHO 8.0 STRL STRW (GLOVE) ×3 IMPLANT
GOWN STRL REUS W/ TWL XL LVL3 (GOWN DISPOSABLE) ×1 IMPLANT
GOWN STRL REUS W/TWL 2XL LVL3 (GOWN DISPOSABLE) ×3 IMPLANT
GOWN STRL REUS W/TWL XL LVL3 (GOWN DISPOSABLE) ×3
HOOD PEEL AWAY FACE SHEILD DIS (HOOD) ×9 IMPLANT
KIT BASIN OR (CUSTOM PROCEDURE TRAY) ×3 IMPLANT
KIT ROOM TURNOVER OR (KITS) ×3 IMPLANT
MANIFOLD NEPTUNE II (INSTRUMENTS) ×3 IMPLANT
NDL SAFETY ECLIPSE 18X1.5 (NEEDLE) ×1 IMPLANT
NEEDLE HYPO 18GX1.5 SHARP (NEEDLE) ×3
NS IRRIG 1000ML POUR BTL (IV SOLUTION) ×3 IMPLANT
PACK TOTAL JOINT (CUSTOM PROCEDURE TRAY) ×3 IMPLANT
PAD ARMBOARD 7.5X6 YLW CONV (MISCELLANEOUS) ×6 IMPLANT
PILLOW ABDUCTION HIP (SOFTGOODS) ×3 IMPLANT
PRESSURIZER FEMORAL UNIV (MISCELLANEOUS) IMPLANT
RETRIEVER SUT HEWSON (MISCELLANEOUS) ×3 IMPLANT
SUCTION FRAZIER HANDLE 10FR (MISCELLANEOUS) ×2
SUCTION TUBE FRAZIER 10FR DISP (MISCELLANEOUS) ×1 IMPLANT
SUT FIBERWIRE #2 38 REV NDL BL (SUTURE) ×6
SUT VIC AB 0 CT1 27 (SUTURE) ×3
SUT VIC AB 0 CT1 27XBRD ANBCTR (SUTURE) ×1 IMPLANT
SUT VIC AB 2-0 CT1 27 (SUTURE) ×3
SUT VIC AB 2-0 CT1 TAPERPNT 27 (SUTURE) ×1 IMPLANT
SUT VIC AB 3-0 SH 8-18 (SUTURE) ×3 IMPLANT
SUTURE FIBERWR#2 38 REV NDL BL (SUTURE) ×2 IMPLANT
SYR CONTROL 10ML LL (SYRINGE) ×3 IMPLANT
TOWEL OR 17X24 6PK STRL BLUE (TOWEL DISPOSABLE) IMPLANT
TOWEL OR 17X26 10 PK STRL BLUE (TOWEL DISPOSABLE) IMPLANT
TRAY CATH 16FR W/PLASTIC CATH (SET/KITS/TRAYS/PACK) ×3 IMPLANT
TRAY FOLEY CATH SILVER 14FR (SET/KITS/TRAYS/PACK) IMPLANT

## 2017-04-26 NOTE — Op Note (Signed)
04/26/2017  9:31 AM  PATIENT:  Daniel [name]   MRN: [id number]  PRE-OPERATIVE DIAGNOSIS:  Avascular necrosis of bone of hip, left ([facility])  POST-OPERATIVE DIAGNOSIS:  Avascular necrosis of bone of hip, left ([facility])  PROCEDURE:  Procedure(s): LEFT TOTAL HIP ARTHROPLASTY  PREOPERATIVE INDICATIONS:    Daniel [name] is an 56 y.o. male who has a diagnosis of Avascular necrosis of bone of hip, left ([place]) and elected for surgical management after failing conservative treatment.  The risks benefits and alternatives were discussed with the patient including but not limited to the risks of nonoperative treatment, versus surgical intervention including infection, bleeding, nerve injury, periprosthetic fracture, the need for revision surgery, dislocation, leg length discrepancy, blood clots, cardiopulmonary complications, morbidity, mortality, among others, and they were willing to proceed.     OPERATIVE REPORT     SURGEON:  Marchia Bond, MD    ASSISTANT:  Joya Gaskins, OPA-C  (Present throughout the entire procedure,  necessary for completion of procedure in a timely manner, assisting with retraction, instrumentation, and closure)     ANESTHESIA:  Spinal  ESTIMATED BLOOD LOSS: 409 mL    COMPLICATIONS:  None.     UNIQUE ASPECTS OF THE CASE:  Her bone quality was remarkably poor. There was no collapse of the femoral head. My capsulotomy was slightly low on the neck, and placement of the Cobra retractors into the acetabular anterior and posterior regions were somewhat challenging, but I was able to have excellent exposure. The reaming was very easy, and during final placement I was directly opposed to the medial wall and had some medial membrane exposed. I did place some graft along the medial aspect of the cup from the acetabular reamings. I had excellent fixation of the cup with circumferential coverage, I was flush posteriorly, and had a slight bit of anterior bone exposed, as well as  superiorly.  I felt like I had medialized more than anticipated, and therefore utilized a high offset, and soft tissue tension was appropriate for completion of the case. He had significant shuck, because he did not have contracture of his periacetabular ligaments. He had excellent stability posteriorly and no impingement with external rotation. I did not need a posterior lip.  COMPONENTS:  [employer] fit femur size 6 high offset with a 36 mm + 5 head ball and a gription acetabular shell size 54 with an apex hole eliminator and a +4 neutral polyethylene liner.    PROCEDURE IN DETAIL:   The patient was met in the holding area and  identified.  The appropriate hip was identified and marked at the operative site.  The patient was then transported to the OR  and  placed under anesthesia.  At that point, the patient was  placed in the lateral decubitus position with the operative side up and  secured to the operating room table and all bony prominences padded.     The operative lower extremity was prepped from the iliac crest to the distal leg.  Sterile draping was performed.  Time out was performed prior to incision.      A routine posterolateral approach was utilized via sharp dissection  carried down to the subcutaneous tissue.  Gross bleeders were Bovie coagulated.  The iliotibial band was identified and incised along the length of the skin incision.  Self-retaining retractors were  inserted.  With the hip internally rotated, the short external rotators  were identified. The piriformis and capsule was tagged with FiberWire, and the  hip capsule released in a T-type fashion.  The femoral neck was exposed, and I resected the femoral neck using the appropriate jig. This was performed at approximately a thumb's breadth above the lesser trochanter.    I then exposed the deep acetabulum, cleared out any tissue including the ligamentum teres.  A wing retractor was placed.  After adequate visualization,  I excised the labrum, and then sequentially reamed.  I went straight to the 54 cup, but I did place some bone medially because I felt like I had gotten through the medial wall to a small degree.  Appropriate version and inclination was confirmed clinically matching their bony anatomy, and also with the use of the jig.  A trial polyethylene liner was placed and the wing retractor removed.    I then prepared the proximal femur using the cookie-cutter, the lateralizing reamer, and then sequentially reamed and broached.  A trial broach, neck, and head was utilized, and I reduced the hip and it was found to have excellent stability with functional range of motion. The trial components were then removed, and the real polyethylene liner was placed.  I then impacted the real femoral prosthesis into place into the appropriate version, slightly anteverted to the normal anatomy, and I impacted the real head ball into place. The hip was then reduced and taken through functional range of motion and found to have excellent stability. Leg lengths were restored.  I then used a 2 mm drill bits to pass the FiberWire suture from the capsule and piriformis through the greater trochanter, and secured this. Excellent posterior capsular repair was achieved. I also closed the T in the capsule.  I then irrigated the hip copiously again with pulse lavage, and repaired the fascia with Vicryl, followed by Vicryl for the subcutaneous tissue, Monocryl for the skin, Steri-Strips and sterile gauze. The wounds were injected. The patient was then awakened and returned to PACU in stable and satisfactory condition. There were no complications.  Marchia Bond, MD Orthopedic Surgeon [phone]   04/26/2017 9:31 AM

## 2017-04-26 NOTE — Anesthesia Procedure Notes (Signed)
Spinal  Patient location during procedure: OR Start time: 04/26/2017 8:18 AM End time: 04/26/2017 8:20 AM Staffing Anesthesiologist: Randal Goens Preanesthetic Checklist Completed: patient identified, site marked, surgical consent, pre-op evaluation, timeout performed, IV checked, risks and benefits discussed and monitors and equipment checked Spinal Block Patient position: sitting Prep: DuraPrep Patient monitoring: heart rate, blood pressure and continuous pulse ox Approach: midline Location: L4-5 Injection technique: single-shot Needle Needle type: Quincke and Pencan  Needle gauge: 24 G Needle length: 9 cm Assessment Sensory level: T10

## 2017-04-26 NOTE — Transfer of Care (Signed)
Immediate Anesthesia Transfer of Care Note  Patient: Daniel [name]  Procedure(s) Performed: Procedure(s): LEFT TOTAL HIP ARTHROPLASTY (Left)  Patient Location: PACU  Anesthesia Type:MAC and Spinal  Level of Consciousness: awake, alert  and oriented  Airway & Oxygen Therapy: Patient Spontanous Breathing  Post-op Assessment: Report given to RN and Post -op Vital signs reviewed and stable  Post vital signs: Reviewed and stable  Last Vitals:  Vitals:   04/26/17 0643 04/26/17 0650  BP: 140/85   Pulse: (!) 56   Resp: 18   Temp:  36.6 C    Last Pain:  Vitals:   04/26/17 0650  TempSrc: Oral  PainSc:          Complications: No apparent anesthesia complications

## 2017-04-26 NOTE — Anesthesia Postprocedure Evaluation (Signed)
Anesthesia Post Note  Patient: Daniel [name]  Procedure(s) Performed: Procedure(s) (LRB): LEFT TOTAL HIP ARTHROPLASTY (Left)     Anesthesia Post Evaluation  Last Vitals:  Vitals:   04/26/17 1045 04/26/17 1115  BP: 93/67 107/76  Pulse: (!) 52 (!) 58  Resp: 13 (!) 7  Temp:      Last Pain:  Vitals:   04/26/17 1045  TempSrc:   PainSc: 4                  Harriette Tovey

## 2017-04-26 NOTE — H&P (Signed)
PREOPERATIVE H&P  Chief Complaint: left hip AVN  HPI: Daniel [name] is a 56 y.o. male who presents for preoperative history and physical with a diagnosis of left hip AVN. Symptoms are rated as moderate to severe, and have been worsening.  This is significantly impairing activities of daily living.  He has elected for surgical management.   He has failed injections, activity modification, anti-inflammatories, and assistive devices.  Preoperative MRI demonstrates large area of left hip AVN.   Past Medical History:  Diagnosis Date  . Abrasion of hand, right 08/26/2014  . Anxiety   . Arthritis   . Cubital tunnel syndrome on right 08/2014  . Dental bridge present    lower left  . Dental crown present    lower right  . GERD (gastroesophageal reflux disease)   . PONV (postoperative nausea and vomiting)   . 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 ARTERY REPAIR Right 08/30/2014   Procedure: RIGHT ULNAR ARTERY RESECTION WITH VEIN GRAFT;  Surgeon: Charlotte Crumb, MD;  Location: [facility];  Service: Orthopedics;  Laterality: Right;  . ELBOW ARTHROSCOPY WITH TENDON RECONSTRUCTION Left 07/01/2011   ECRL and brevis  . TONSILLECTOMY    . ULNAR TUNNEL RELEASE Right 08/30/2014   Procedure: RIGHT CUBITAL TUNNEL RELEASE;  Surgeon: Charlotte Crumb, MD;  Location: [facility];  Service: Orthopedics;  Laterality: Right;  . UMBILICAL HERNIA REPAIR    . WRIST ARTHROSCOPY WITH ULNA SHORTENING Right 12/28/2007   Social History   Social History  . Marital status: Married    Spouse name: N/A  . Number of children: N/A  . Years of education: N/A   Social History Main Topics  . Smoking status: Former Research scientist (life sciences)  . Smokeless tobacco: Never Used  . Alcohol use Yes     Comment: occasionally  . Drug use: No  . Sexual activity: Yes   Other Topics Concern  . None   Social History Narrative  . None   Family  History  Problem Relation Age of Onset  . Colon cancer Neg Hx    Allergies  Allergen Reactions  . Vicodin [Hydrocodone-Acetaminophen] Itching   Prior to Admission medications   Medication Sig Start Date End Date Taking? Authorizing Provider  aspirin EC 81 MG tablet Take 81 mg by mouth daily.   Yes [provider]  citalopram (CELEXA) 40 MG tablet Take 40 mg by mouth at bedtime.    Yes [provider]  ibuprofen (ADVIL,MOTRIN) 200 MG tablet Take 400 mg by mouth daily as needed for headache or mild pain.    Yes [provider]  lansoprazole (PREVACID) 15 MG capsule Take 15 mg by mouth daily at 12 noon.   Yes [provider]     Positive ROS: All other systems have been reviewed and were otherwise negative with the exception of those mentioned in the HPI and as above.  Physical Exam: General: Alert, no acute distress Cardiovascular: No pedal edema Respiratory: No cyanosis, no use of accessory musculature GI: No organomegaly, abdomen is soft and non-tender Skin: No lesions in the area of chief complaint Neurologic: Sensation intact distally Psychiatric: Patient is competent for consent with normal mood and affect Lymphatic: No axillary or cervical lymphadenopathy  MUSCULOSKELETAL: left hip AROM 0-90, IR 20, ER to 20 with painful arc throughout  Assessment: left hip AVN   Plan: Plan for Procedure(s): LEFT TOTAL HIP ARTHROPLASTY  The risks benefits  and alternatives were discussed with the patient including but not limited to the risks of nonoperative treatment, versus surgical intervention including infection, bleeding, nerve injury,  blood clots, cardiopulmonary complications, morbidity, mortality, among others, and they were willing to proceed.   The risks benefits and alternatives were discussed with the patient including but not limited to the risks of nonoperative treatment, versus surgical intervention including infection, bleeding, nerve  injury, periprosthetic fracture, the need for revision surgery, dislocation, leg length discrepancy, blood clots, cardiopulmonary complications, morbidity, mortality, among others, and they were willing to proceed.     Johnny Bridge, MD Cell [phone]   04/26/2017 7:22 AM

## 2017-04-26 NOTE — Anesthesia Procedure Notes (Signed)
Procedure Name: MAC Date/Time: 04/26/2017 7:35 AM Performed by: Garrison Columbus T Pre-anesthesia Checklist: Patient identified, Emergency Drugs available, Suction available and Patient being monitored Patient Re-evaluated:Patient Re-evaluated prior to inductionOxygen Delivery Method: Simple face mask Preoxygenation: Pre-oxygenation with 100% oxygen Intubation Type: IV induction Placement Confirmation: positive ETCO2 and breath sounds checked- equal and bilateral Dental Injury: Teeth and Oropharynx as per pre-operative assessment

## 2017-04-26 NOTE — Progress Notes (Signed)
PT Cancellation Note  Patient Details Name: JOVE BEYL MRN: [id number] DOB: 1961/06/16   Cancelled Treatment:    Reason Eval/Treat Not Completed: Other (comment) Pt's wife reporting pt just got to sleep and has been in a lot of pain and requesting to let him sleep for awhile. Will reattempt as schedule allows.   Leighton Ruff, PT, DPT  Acute Rehabilitation Services  Pager: [phone]    Rudean Hitt 04/26/2017, 3:29 PM

## 2017-04-27 ENCOUNTER — Inpatient Hospital Stay (HOSPITAL_COMMUNITY): Payer: PRIVATE HEALTH INSURANCE

## 2017-04-27 LAB — BASIC METABOLIC PANEL
ANION GAP: 7 (ref 5–15)
BUN: 7 mg/dL (ref 6–20)
CALCIUM: 8.2 mg/dL — AB (ref 8.9–10.3)
CHLORIDE: 100 mmol/L — AB (ref 101–111)
CO2: 26 mmol/L (ref 22–32)
Creatinine, Ser: 0.93 mg/dL (ref 0.61–1.24)
GFR calc non Af Amer: >60 mL/min (ref >60)
Glucose, Bld: 130 mg/dL — ABNORMAL HIGH (ref 65–99)
POTASSIUM: 3.8 mmol/L (ref 3.5–5.1)
Sodium: 133 mmol/L — ABNORMAL LOW (ref 135–145)

## 2017-04-27 LAB — CBC
HEMATOCRIT: 35.7 % — AB (ref 39.0–52.0)
Hemoglobin: 12.2 g/dL — ABNORMAL LOW (ref 13.0–17.0)
MCH: 32.3 pg (ref 26.0–34.0)
MCHC: 34.2 g/dL (ref 30.0–36.0)
MCV: 94.4 fL (ref 78.0–100.0)
Platelets: 234 10*3/uL (ref 150–400)
RBC: 3.78 MIL/uL — ABNORMAL LOW (ref 4.22–5.81)
RDW: 13.6 % (ref 11.5–15.5)
WBC: 8.8 10*3/uL (ref 4.0–10.5)

## 2017-04-27 IMAGING — CR DG PELVIS 3+V JUDET
3 series · 3 of 3 positions shown · non-contrast
Comparison: AP portable postoperative view dated [DATE]

CLINICAL DATA: Status post left total hip joint prosthesis
placement yesterday.

EXAM:
JUDET PELVIS - 3+ VIEW

[pelvis obl (1 of 2)]
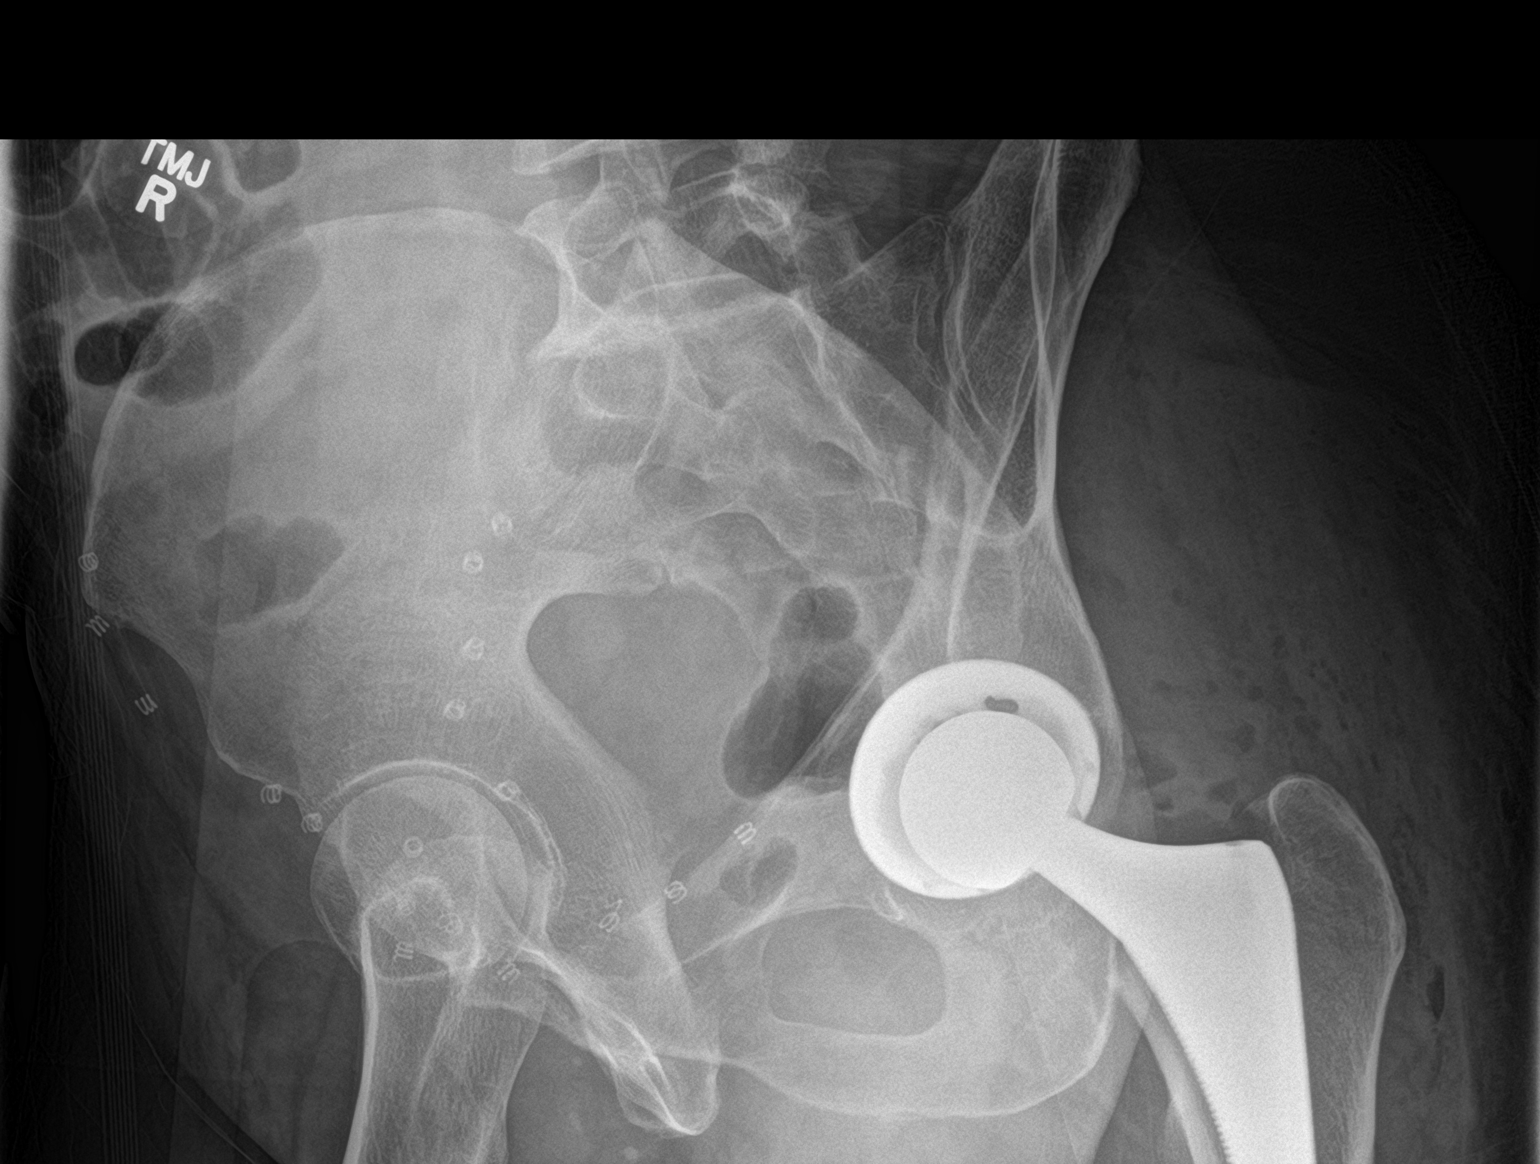

[pelvis obl (2 of 2)]
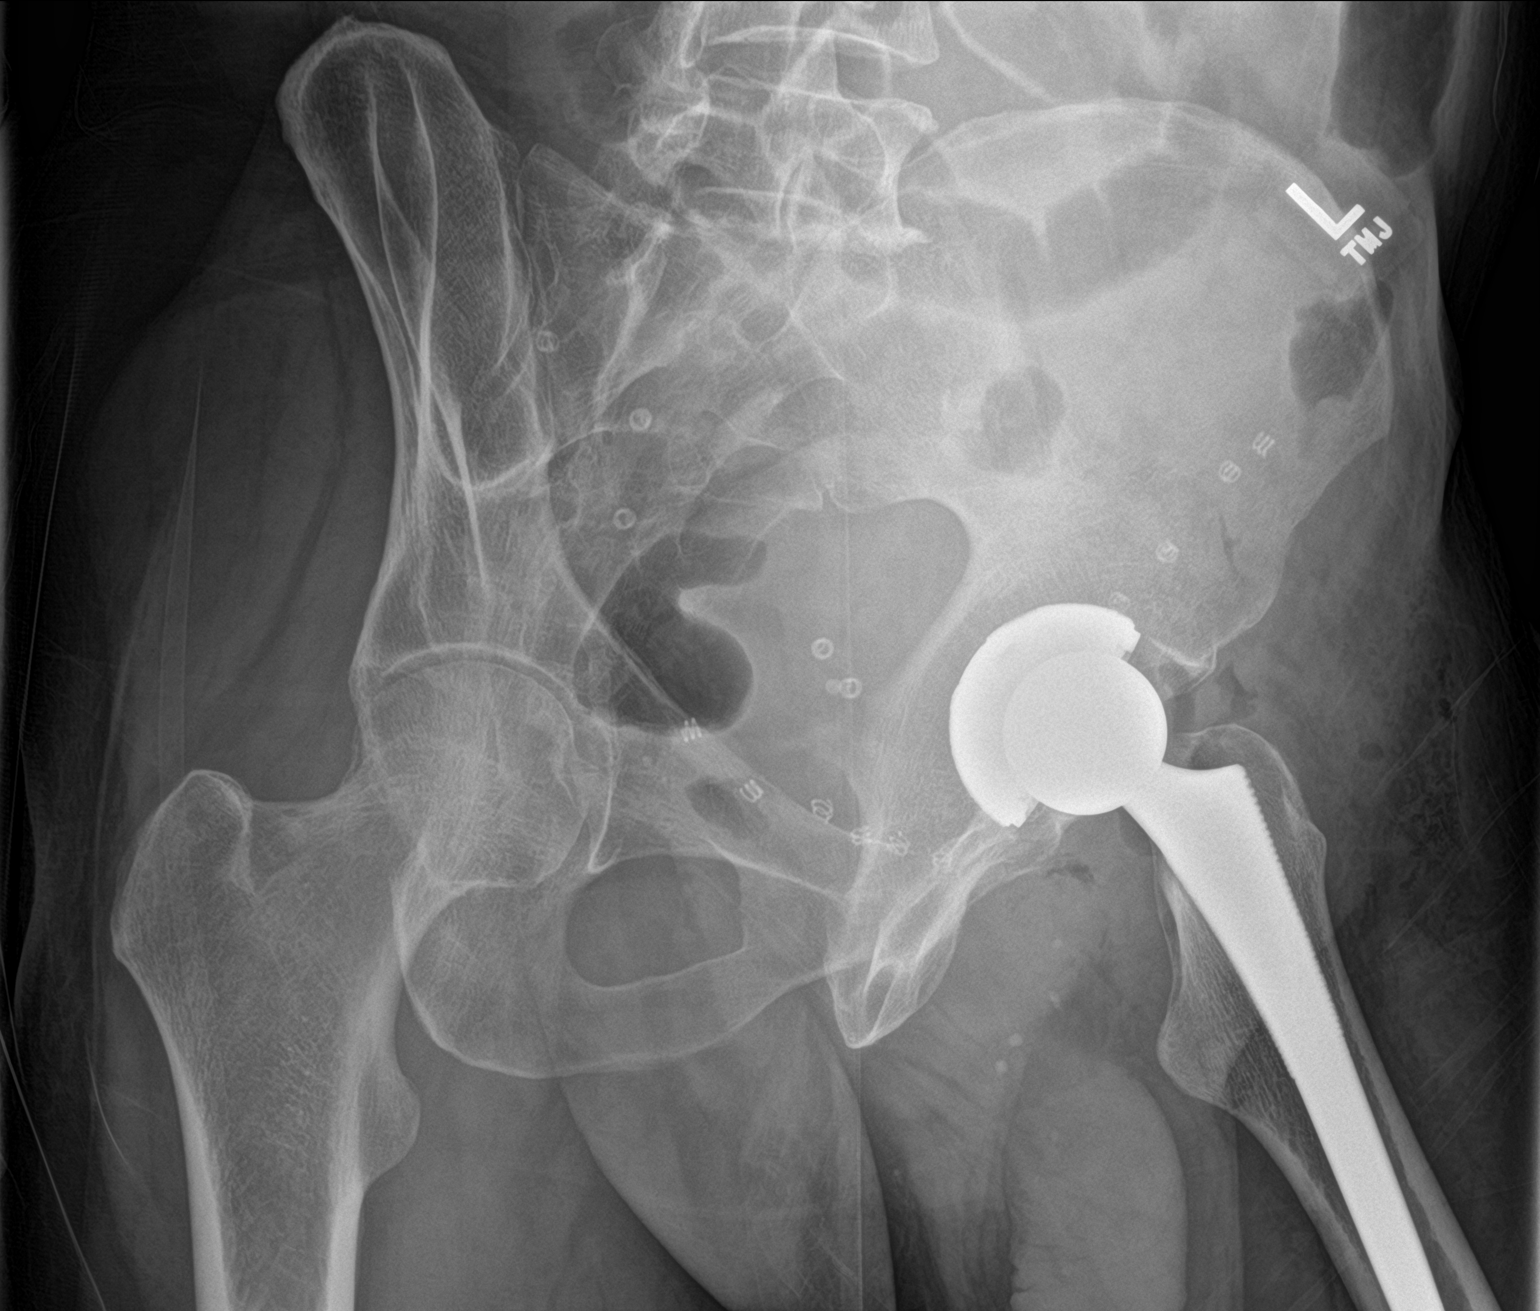

[pelvis ap]
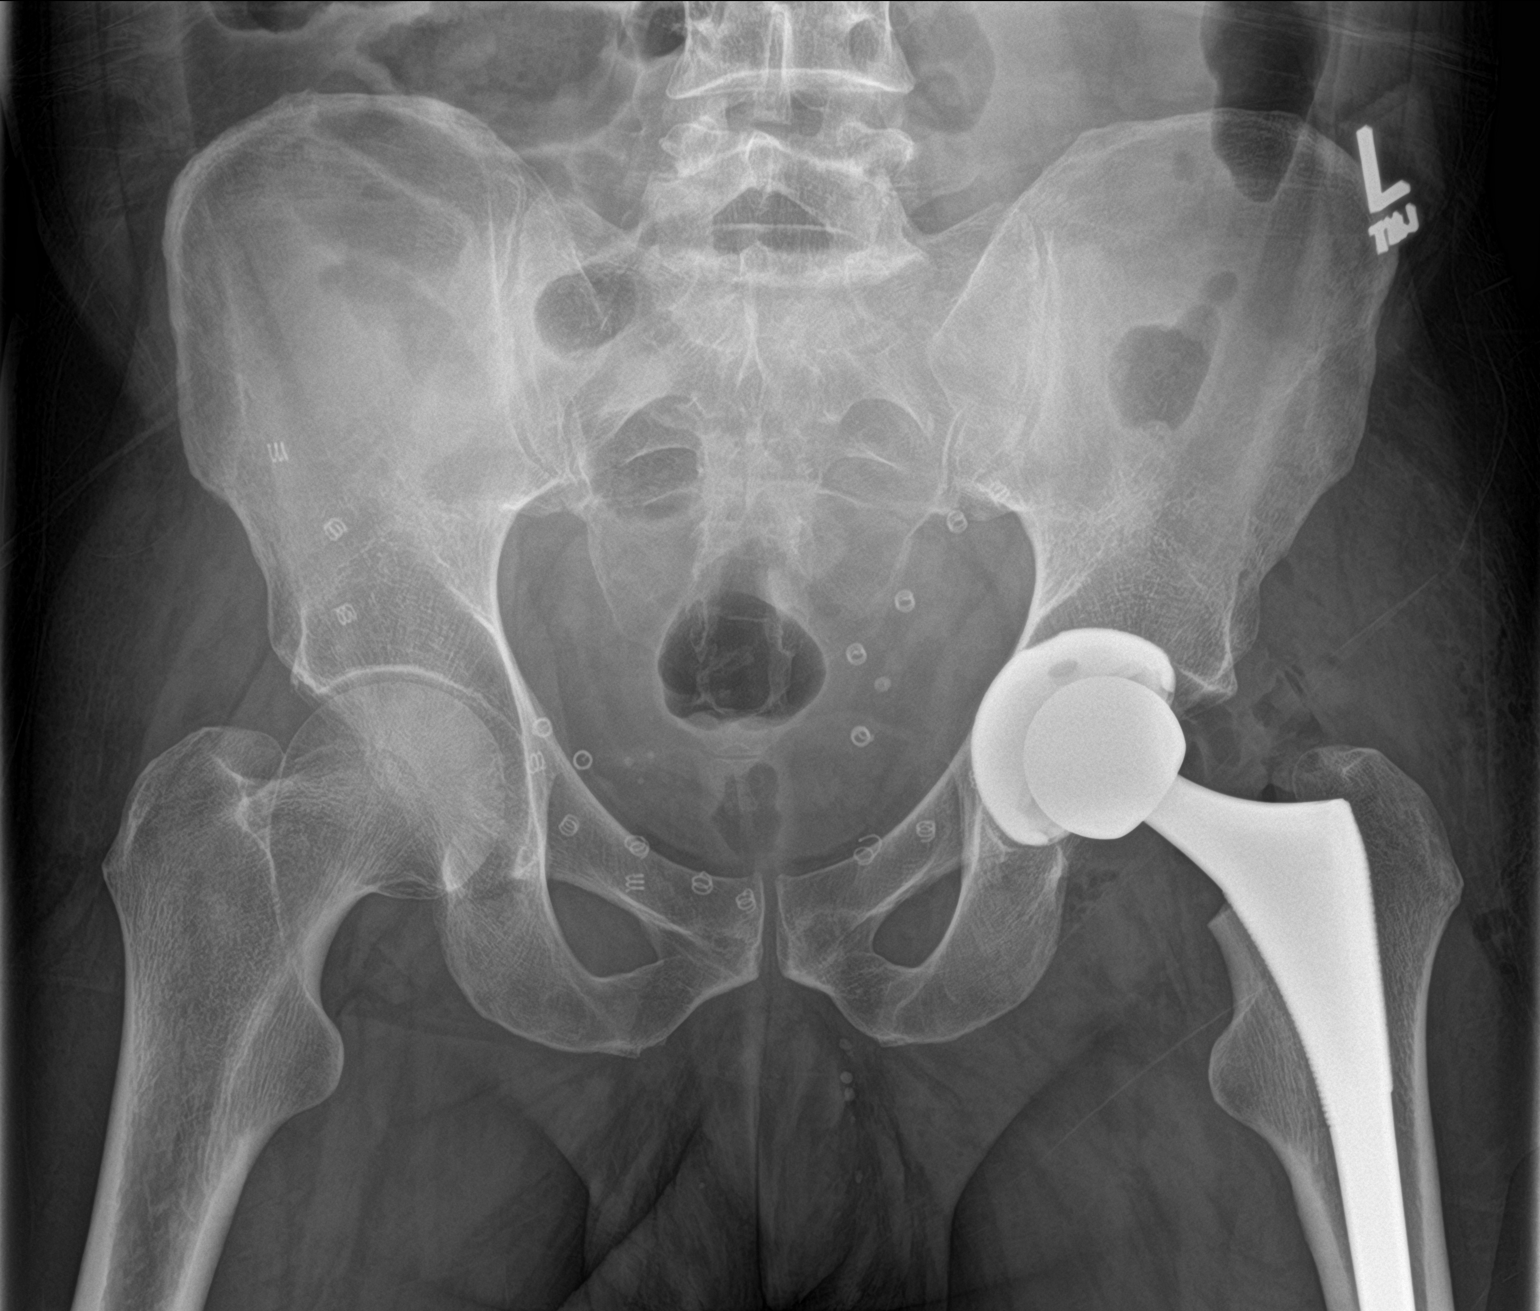

[3 of 3 positions shown; findings below may reference images not displayed]

FINDINGS: The bones are subjectively adequately mineralized. The left hip
joint prosthesis appears to be in reasonable position. The interface
with the native bone appears normal. The tip of the its femoral stem
of the prosthesis is not included in the field of view however.
There is symmetric narrowing of the right hip joint space. The bony
pelvis exhibits no acute lytic or blastic lesion.
IMPRESSION: Status post left total hip joint prosthesis placement yesterday. No
postprocedure complication.

## 2017-04-27 NOTE — Discharge Instructions (Signed)
INSTRUCTIONS AFTER JOINT REPLACEMENT  ° °o Remove items at home which could result in a fall. This includes throw rugs or furniture in walking pathways °o ICE to the affected joint every three hours while awake for 30 minutes at a time, for at least the first 3-5 days, and then as needed for pain and swelling.  Continue to use ice for pain and swelling. You may notice swelling that will progress down to the foot and ankle.  This is normal after surgery.  Elevate your leg when you are not up walking on it.   °o Continue to use the breathing machine you got in the hospital (incentive spirometer) which will help keep your temperature down.  It is common for your temperature to cycle up and down following surgery, especially at night when you are not up moving around and exerting yourself.  The breathing machine keeps your lungs expanded and your temperature down. ° ° °DIET:  As you were doing prior to hospitalization, we recommend a well-balanced diet. ° °DRESSING / WOUND CARE / SHOWERING ° °You may change your dressing 3-5 days after surgery.  Then change the dressing every day with sterile gauze.  Please use good hand washing techniques before changing the dressing.  Do not use any lotions or creams on the incision until instructed by your surgeon. ° °ACTIVITY ° °o Increase activity slowly as tolerated, but follow the weight bearing instructions below.   °o No driving for 6 weeks or until further direction given by your physician.  You cannot drive while taking narcotics.  °o No lifting or carrying greater than 10 lbs. until further directed by your surgeon. °o Avoid periods of inactivity such as sitting longer than an hour when not asleep. This helps prevent blood clots.  °o You may return to work once you are authorized by your doctor.  ° ° ° °WEIGHT BEARING  ° °Weight bearing as tolerated with assist device (walker, cane, etc) as directed, use it as long as suggested by your surgeon or therapist, typically at  least 4-6 weeks. ° ° °EXERCISES ° °Results after joint replacement surgery are often greatly improved when you follow the exercise, range of motion and muscle strengthening exercises prescribed by your doctor. Safety measures are also important to protect the joint from further injury. Any time any of these exercises cause you to have increased pain or swelling, decrease what you are doing until you are comfortable again and then slowly increase them. If you have problems or questions, call your caregiver or physical therapist for advice.  ° °Rehabilitation is important following a joint replacement. After just a few days of immobilization, the muscles of the leg can become weakened and shrink (atrophy).  These exercises are designed to build up the tone and strength of the thigh and leg muscles and to improve motion. Often times heat used for twenty to thirty minutes before working out will loosen up your tissues and help with improving the range of motion but do not use heat for the first two weeks following surgery (sometimes heat can increase post-operative swelling).  ° °These exercises can be done on a training (exercise) mat, on the floor, on a table or on a bed. Use whatever works the best and is most comfortable for you.    Use music or television while you are exercising so that the exercises are a pleasant break in your day. This will make your life better with the exercises acting as a break   in your routine that you can look forward to.   Perform all exercises about fifteen times, three times per day or as directed.  You should exercise both the operative leg and the other leg as well. ° °Exercises include: °  °• Quad Sets - Tighten up the muscle on the front of the thigh (Quad) and hold for 5-10 seconds.   °• Straight Leg Raises - With your knee straight (if you were given a brace, keep it on), lift the leg to 60 degrees, hold for 3 seconds, and slowly lower the leg.  Perform this exercise against  resistance later as your leg gets stronger.  °• Leg Slides: Lying on your back, slowly slide your foot toward your buttocks, bending your knee up off the floor (only go as far as is comfortable). Then slowly slide your foot back down until your leg is flat on the floor again.  °• Angel Wings: Lying on your back spread your legs to the side as far apart as you can without causing discomfort.  °• Hamstring Strength:  Lying on your back, push your heel against the floor with your leg straight by tightening up the muscles of your buttocks.  Repeat, but this time bend your knee to a comfortable angle, and push your heel against the floor.  You may put a pillow under the heel to make it more comfortable if necessary.  ° °A rehabilitation program following joint replacement surgery can speed recovery and prevent re-injury in the future due to weakened muscles. Contact your doctor or a physical therapist for more information on knee rehabilitation.  ° ° °CONSTIPATION ° °Constipation is defined medically as fewer than three stools per week and severe constipation as less than one stool per week.  Even if you have a regular bowel pattern at home, your normal regimen is likely to be disrupted due to multiple reasons following surgery.  Combination of anesthesia, postoperative narcotics, change in appetite and fluid intake all can affect your bowels.  ° °YOU MUST use at least one of the following options; they are listed in order of increasing strength to get the job done.  They are all available over the counter, and you may need to use some, POSSIBLY even all of these options:   ° °Drink plenty of fluids (prune juice may be helpful) and high fiber foods °Colace 100 mg by mouth twice a day  °Senokot for constipation as directed and as needed Dulcolax (bisacodyl), take with full glass of water  °Miralax (polyethylene glycol) once or twice a day as needed. ° °If you have tried all these things and are unable to have a bowel  movement in the first 3-4 days after surgery call either your surgeon or your primary doctor.   ° °If you experience loose stools or diarrhea, hold the medications until you stool forms back up.  If your symptoms do not get better within 1 week or if they get worse, check with your doctor.  If you experience "the worst abdominal pain ever" or develop nausea or vomiting, please contact the office immediately for further recommendations for treatment. ° ° °ITCHING:  If you experience itching with your medications, try taking only a single pain pill, or even half a pain pill at a time.  You can also use Benadryl over the counter for itching or also to help with sleep.  ° °TED HOSE STOCKINGS:  Use stockings on both legs until for at least 2 weeks or as   directed by physician office. They may be removed at night for sleeping. ° °MEDICATIONS:  See your medication summary on the “After Visit Summary” that nursing will review with you.  You may have some home medications which will be placed on hold until you complete the course of blood thinner medication.  It is important for you to complete the blood thinner medication as prescribed. ° °PRECAUTIONS:  If you experience chest pain or shortness of breath - call 911 immediately for transfer to the hospital emergency department.  ° °If you develop a fever greater that 101 F, purulent drainage from wound, increased redness or drainage from wound, foul odor from the wound/dressing, or calf pain - CONTACT YOUR SURGEON.   °                                                °FOLLOW-UP APPOINTMENTS:  If you do not already have a post-op appointment, please call the office for an appointment to be seen by your surgeon.  Guidelines for how soon to be seen are listed in your “After Visit Summary”, but are typically between 1-4 weeks after surgery. ° °OTHER INSTRUCTIONS:  ° °Knee Replacement:  Do not place pillow under knee, focus on keeping the knee straight while resting. CPM  instructions: 0-90 degrees, 2 hours in the morning, 2 hours in the afternoon, and 2 hours in the evening. Place foam block, curve side up under heel at all times except when in CPM or when walking.  DO NOT modify, tear, cut, or change the foam block in any way. ° °MAKE SURE YOU:  °• Understand these instructions.  °• Get help right away if you are not doing well or get worse.  ° ° °Thank you for letting us be a part of your medical care team.  It is a privilege we respect greatly.  We hope these instructions will help you stay on track for a fast and full recovery!  ° ° ° °Information on my medicine - XARELTO® (Rivaroxaban) ° °This medication education was reviewed with me or my healthcare representative as part of my discharge preparation.  The pharmacist that spoke with me during my hospital stay was:  Avani Sensabaugh Dien, RPH ° °Why was Xarelto® prescribed for you? °Xarelto® was prescribed for you to reduce the risk of blood clots forming after orthopedic surgery. The medical term for these abnormal blood clots is venous thromboembolism (VTE). ° °What do you need to know about xarelto® ? °Take your Xarelto® ONCE DAILY at the same time every day. °You may take it either with or without food. ° °If you have difficulty swallowing the tablet whole, you may crush it and mix in applesauce just prior to taking your dose. ° °Take Xarelto® exactly as prescribed by your doctor and DO NOT stop taking Xarelto® without talking to the doctor who prescribed the medication.  Stopping without other VTE prevention medication to take the place of Xarelto® may increase your risk of developing a clot. ° °After discharge, you should have regular check-up appointments with your healthcare provider that is prescribing your Xarelto®.   ° °What do you do if you miss a dose? °If you miss a dose, take it as soon as you remember on the same day then continue your regularly scheduled once daily regimen the next day. Do not take two   doses of  Xarelto® on the same day.  ° °Important Safety Information °A possible side effect of Xarelto® is bleeding. You should call your healthcare provider right away if you experience any of the following: °? Bleeding from an injury or your nose that does not stop. °? Unusual colored urine (red or dark brown) or unusual colored stools (red or black). °? Unusual bruising for unknown reasons. °? A serious fall or if you hit your head (even if there is no bleeding). ° °Some medicines may interact with Xarelto® and might increase your risk of bleeding while on Xarelto®. To help avoid this, consult your healthcare provider or pharmacist prior to using any new prescription or non-prescription medications, including herbals, vitamins, non-steroidal anti-inflammatory drugs (NSAIDs) and supplements. ° °This website has more information on Xarelto®: www.xarelto.com. ° ° ° ° °

## 2017-04-27 NOTE — Progress Notes (Signed)
Physical Therapy Treatment Patient Details Name: Daniel [name] MRN: [id number] DOB: 05/13/1961 Today's Date: 04/27/2017    History of Present Illness Daniel Koepp [name] a 56 y.o.malewho presents for preoperative history and physical with a diagnosis of left hip AVN.    PT Comments    Pt is up to walk with crutches today, much more comfortable than with RW per his report.  He is still progressing with exercises, discussed with him the need to do them regularly between therapy sessions, and discussed avoidance of WB on LLE as well as re-teaching hip precautions (knows 2/3).  Continue with acute therapy and may be ready to try stairs tomorrow if crutches are safely used for gait.  Follow Up Recommendations  Home health PT;Supervision - Intermittent     Equipment Recommendations       Recommendations for Other Services       Precautions / Restrictions Precautions Precautions: Posterior Hip Precaution Booklet Issued: Yes (comment) Precaution Comments: Reviewed and knew 2/3 Restrictions Weight Bearing Restrictions: Yes LLE Weight Bearing: Touchdown weight bearing    Mobility  Bed Mobility               General bed mobility comments: In recliner upon arrival  Transfers Overall transfer level: Needs assistance Equipment used: Crutches Transfers: Sit to/from Stand Sit to Stand: Min guard;Min assist         General transfer comment: v/c's for hand placement, pt able to maintain L LE TWB  Ambulation/Gait Ambulation/Gait assistance: Min guard Ambulation Distance (Feet): 150 Feet Assistive device: Crutches Gait Pattern/deviations: Step-through pattern;Wide base of support Gait velocity: variable Gait velocity interpretation: Below normal speed for age/gender General Gait Details: able to do TDWB on LLE, crutches were used for swing through but cautioned pt to avoid speed, fatiguing effort.   Stairs            Wheelchair Mobility    Modified Rankin  (Stroke Patients Only)       Balance Overall balance assessment: Needs assistance Sitting-balance support: Feet supported Sitting balance-[name]: Good     Standing balance support: Bilateral upper extremity supported Standing balance-[name]: Poor Standing balance comment: improving control mainly from RLE                            Cognition Arousal/Alertness: Awake/alert Behavior During Therapy: WFL for tasks assessed/performed Overall Cognitive Status: Within Functional Limits for tasks assessed                                        Exercises Total Joint Exercises Ankle Circles/Pumps: AROM;Both;5 reps Quad Sets: AROM;Both;10 reps Gluteal Sets: AROM;Both;10 reps Hip ABduction/ADduction: AAROM;AROM;Both;10 reps    General Comments General comments (skin integrity, edema, etc.): pt is more comfortable with shoulders with crutches, reminded him to slow down and TDWB on LLE      Pertinent Vitals/Pain Pain Assessment: Faces Faces Pain Scale: Hurts even more Pain Location: L hip Pain Descriptors / Indicators: Constant Pain Intervention(s): Monitored during session;Premedicated before session;Repositioned    Home Living Family/patient expects to be discharged to:: Private residence Living Arrangements: Spouse/significant other Available Help at Discharge: Family Type of Home: House Home Access: Stairs to enter Entrance Stairs-Rails: Can reach both Home Layout: One level Home Equipment: Environmental consultant - 2 wheels;Cane - single point;Grab bars - toilet;Grab bars - tub/shower;Bedside commode;Adaptive equipment  Prior Function Level of Independence: Independent      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 : A Little Difficulty sitting down on and standing up from a chair with arms (e.g., wheelchair, bedside commode, etc,.)?: A Little Help needed moving to and from a bed to chair (including a wheelchair)?: A Little Help needed walking in hospital room?: A Little Help needed climbing 3-5 steps with a railing? : A Lot 6 Click Score: 17    End of Session Equipment Utilized During Treatment: Gait belt Activity Tolerance: Patient limited by pain Patient left: in chair;with call bell/phone within reach Nurse Communication: Mobility status PT Visit Diagnosis: Pain;Difficulty in walking, not elsewhere classified (R26.2) Pain - Right/Left: Left Pain - part of body: Hip     Time: 7654-6503 PT Time Calculation (min) (ACUTE ONLY): 30 min  Charges:  $Gait Training: 8-22 mins $Therapeutic Exercise: 8-22 mins                    G Codes:        Ramond Dial 05/13/2017, 5:12 PM   Mee Hives, PT MS Acute Rehab Dept. Number: [place] and [place]

## 2017-04-27 NOTE — Progress Notes (Signed)
     Subjective:  Patient reports pain as marked.  Reports limited mobility.  Denies significant etoh history.  Objective:   VITALS:   Vitals:   04/26/17 1310 04/26/17 2139 04/26/17 2300 04/27/17 0500  BP: 121/77 110/64 101/73 115/78  Pulse: 65 76 86 (!) 101  Resp: 18 16 16 16   Temp: 97.5 F (36.4 C) 98.7 F (37.1 C) 98.4 F (36.9 C) 99.3 F (37.4 C)  TempSrc: Axillary Oral Oral Oral  SpO2: 100% 97% 98% 95%  Weight:        Neurologically intact Dorsiflexion/Plantar flexion intact Incision: dressing C/D/I   Lab Results  Component Value Date   WBC 8.8 04/27/2017   HGB 12.2 (L) 04/27/2017   HCT 35.7 (L) 04/27/2017   MCV 94.4 04/27/2017   PLT 234 04/27/2017   BMET    Component Value Date/Time   NA 133 (L) 04/27/2017 0453   K 3.8 04/27/2017 0453   CL 100 (L) 04/27/2017 0453   CO2 26 04/27/2017 0453   GLUCOSE 130 (H) 04/27/2017 0453   BUN 7 04/27/2017 0453   CREATININE 0.93 04/27/2017 0453   CALCIUM 8.2 (L) 04/27/2017 0453   GFRNONAA >60 04/27/2017 0453   GFRAA >60 04/27/2017 0453     Assessment/Plan: 1 Day Post-Op   Principal Problem:   Avascular necrosis of bone of hip, left (HCC) Active Problems:   Avascular necrosis of bone of left hip (HCC)   Advance diet Up with therapy Plan for discharge tomorrow I'm also going to get Judet views of his hip in order to evaluate the acetabular position.   Zonia Caplin P 04/27/2017, 8:36 AM   Marchia Bond, MD Cell [phone]

## 2017-04-27 NOTE — Evaluation (Signed)
Occupational Therapy Evaluation Patient Details Name: Daniel [name] MRN: [id number] DOB: Jul 12, 1961 Today's Date: 04/27/2017    History of Present Illness Treyvion Durkee [name] a 56 y.o.malewho presents for preoperative history and physical with a diagnosis of left hip AVN.   Clinical Impression   PTA, pt was living with his wife and was independent. Currently, pt requires Min guard A for LB ADLs with AE. Provided education on LB ADLs with AE; pt demonstrated understanding and reports that his wife will assist as needed. Pt able to recall 1/3 posterior hip precautions; reviewed precautions and pt verbalized understanding. Pt would benefit from acute OT to increase safety and independence with ADLs and functional mobility. Recommend dc home once medically stable per physician.     Follow Up Recommendations  DC plan and follow up therapy as arranged by surgeon;Supervision/Assistance - 24 hour    Equipment Recommendations  None recommended by OT (Pt reports that he has 3N1 at home)    Recommendations for Other Services PT consult     Precautions / Restrictions Precautions Precautions: Posterior Hip Precaution Booklet Issued: Yes (comment) Precaution Comments: Reviewed precautions. Pt only able to recall 1/3 precautions. Requires Min VCs and visual picture to recall nto crossing legs or bending past 90 degrees Restrictions Weight Bearing Restrictions: Yes LLE Weight Bearing: Touchdown weight bearing      Mobility Bed Mobility Overal bed mobility: Needs Assistance Bed Mobility: Supine to Sit     Supine to sit: Min assist;Mod assist     General bed mobility comments: In recliner upon arrival  Transfers Overall transfer level: Needs assistance Equipment used: Rolling walker (2 wheeled) Transfers: Sit to/from Stand Sit to Stand: Min assist         General transfer comment: v/c's for hand placement, pt able to maintain L LE TWB    Balance Overall balance assessment:  Needs assistance Sitting-balance support: Bilateral upper extremity supported;Feet supported Sitting balance-[name]: Good     Standing balance support: Bilateral upper extremity supported Standing balance-[name]: Poor Standing balance comment: dependent on RW for stability due to L LE TWB                           ADL either performed or assessed with clinical judgement   ADL Overall ADL's : Needs assistance/impaired Eating/Feeding: Set up;Sitting   Grooming: Set up;Sitting   Upper Body Bathing: Set up;Sitting   Lower Body Bathing: Min guard;With adaptive equipment;Sit to/from stand   Upper Body Dressing : Set up;Sitting   Lower Body Dressing: Min guard;With adaptive equipment;Cueing for sequencing;Adhering to hip precautions;Sit to/from stand Lower Body Dressing Details (indicate cue type and reason): Pt donned underwear with AE while adhereing to TDWB and hip precautions. Min gaurd for safety               General ADL Comments: Pt limited by significant pain. Educated pt on LB ADLs and use of AE. Pt reports that he has AE at home     Vision         Perception     Praxis      Pertinent Vitals/Pain Pain Assessment: Faces Pain Score: 8  Faces Pain Scale: Hurts whole lot Pain Location: L hip Pain Descriptors / Indicators: Constant Pain Intervention(s): Monitored during session;Limited activity within patient's tolerance;Repositioned;Ice applied     Hand Dominance Right   Extremity/Trunk Assessment Upper Extremity Assessment Upper Extremity Assessment: Overall WFL for tasks assessed (Reports L shoulder  muscle tear PTA)   Lower Extremity Assessment Lower Extremity Assessment: Defer to PT evaluation LLE Deficits / Details: able to initiate quad set and AA hip/knee rom in supine to about 35 deg   Cervical / Trunk Assessment Cervical / Trunk Assessment: Normal   Communication Communication Communication: No difficulties   Cognition  Arousal/Alertness: Awake/alert Behavior During Therapy: WFL for tasks assessed/performed Overall Cognitive Status: Within Functional Limits for tasks assessed                                     General Comments  Pt reporting that he has difficulty using RW and PT will be transitioning him to crutches.     Exercises    Shoulder Instructions      Home Living Family/patient expects to be discharged to:: Private residence Living Arrangements: Spouse/significant other Available Help at Discharge: Family Type of Home: House Home Access: Stairs to enter Technical brewer of Steps: 4 Entrance Stairs-Rails: Can reach both [place]: One level     Bathroom Shower/Tub: Teacher, early years/pre: Handicapped height     Home Equipment: Environmental consultant - 2 wheels;Cane - single point;Grab bars - toilet;Grab bars - tub/shower;Bedside commode;Adaptive equipment Adaptive Equipment: Reacher;Long-handled shoe horn;Long-handled sponge        Prior Functioning/Environment Level of Independence: Independent        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 Problem List: Decreased strength;Decreased range of motion;Decreased activity tolerance;Impaired balance (sitting and/or standing);Decreased safety awareness;Decreased knowledge of use of DME or AE;Decreased knowledge of precautions;Pain      OT Treatment/Interventions: Self-care/ADL training;Therapeutic exercise;Energy conservation;DME and/or AE instruction;Therapeutic activities;Patient/family education    OT Goals(Current goals can be found in the care plan section) Acute Rehab OT Goals Patient Stated Goal: stop the pain and get on my bike in october OT Goal Formulation: With patient Time For Goal Achievement: 05/11/17 Potential to Achieve Goals: Good ADL Goals Pt Will Perform Lower Body Dressing: with min guard assist;sit to/from stand;with adaptive equipment Pt Will Transfer to Toilet: with min guard  assist;bedside commode;ambulating Pt Will Perform Toileting - Clothing Manipulation and hygiene: with min guard assist;sit to/from stand Pt Will Perform Tub/Shower Transfer: Tub transfer;3 in 1;ambulating;rolling walker;with min guard assist  OT Frequency: Min 2X/week   Barriers to D/C:            Co-evaluation              AM-PAC PT "6 Clicks" Daily Activity     Outcome Measure Help from another person eating meals?: None Help from another person taking care of personal grooming?: A Little Help from another person toileting, which includes using toliet, bedpan, or urinal?: A Little Help from another person bathing (including washing, rinsing, drying)?: A Little Help from another person to put on and taking off regular upper body clothing?: None Help from another person to put on and taking off regular lower body clothing?: A Little 6 Click Score: 20   End of Session Equipment Utilized During Treatment: Rolling walker Nurse Communication: Mobility status;Weight bearing status;Precautions  Activity Tolerance: Patient tolerated treatment well;Patient limited by pain Patient left: in chair;with call bell/phone within reach  OT Visit Diagnosis: Unsteadiness on feet (R26.81);Other abnormalities of gait and mobility (R26.89);Muscle weakness (generalized) (M62.81);Pain Pain - Right/Left: Left Pain - part of body: Hip  Time: 0786-7544 OT Time Calculation (min): 16 min Charges:  OT General Charges $OT Visit: 1 Procedure OT Evaluation $OT Eval Low Complexity: 1 Procedure G-Codes:     Coleman Kalas MSOT, OTR/L Acute Rehab Pager: [phone] Office: [place] 04/27/2017, 2:28 PM

## 2017-04-27 NOTE — Evaluation (Signed)
Physical Therapy Evaluation Patient Details Name: Daniel [name] MRN: [id number] DOB: 1961-03-04 Today's Date: 04/27/2017   History of Present Illness  Daniel Drudge [name] a 56 y.o.malewho presents for preoperative history and physical with a diagnosis of left hip AVN.  Clinical Impression  Pt is s/p THA resulting in the deficits listed below (see PT Problem List). Pt limited by pain but demo's good rehab potential. Pt will benefit from skilled PT to increase their independence and safety with mobility to allow discharge to the venue listed below.      Follow Up Recommendations Home health PT;Supervision - Intermittent (may be able to go right to outpt)    Equipment Recommendations  Rolling walker with 5" wheels;3in1 (PT) (pt already has at home )    Recommendations for Other Services       Precautions / Restrictions Precautions Precautions: Posterior Hip Precaution Booklet Issued: Yes (comment) Precaution Comments: precautions posted above bed and pt given a copy. Pt with good carry over Restrictions Weight Bearing Restrictions: Yes LLE Weight Bearing: Touchdown weight bearing      Mobility  Bed Mobility Overal bed mobility: Needs Assistance Bed Mobility: Supine to Sit     Supine to sit: Min assist;Mod assist     General bed mobility comments: min/modA for L LE management, max directional v/c's for long sit technique and to use UEs to scoot bottom to EOB  Transfers Overall transfer level: Needs assistance Equipment used: Rolling walker (2 wheeled) Transfers: Sit to/from Stand Sit to Stand: Min assist         General transfer comment: v/c's for hand placement, pt able to maintain L LE TWB  Ambulation/Gait Ambulation/Gait assistance: Min guard Ambulation Distance (Feet): 50 Feet Assistive device: Rolling walker (2 wheeled) Gait Pattern/deviations: Step-to pattern Gait velocity: slow Gait velocity interpretation: Below normal speed for age/gender General  Gait Details: pt c/o of bilat UE fatiguing out and becoming sore from many precious surgeries. Pt able to maintain L LE TWB  Stairs            Wheelchair Mobility    Modified Rankin (Stroke Patients Only)       Balance Overall balance assessment: Needs assistance Sitting-balance support: Bilateral upper extremity supported;Feet supported Sitting balance-[name]: Good     Standing balance support: Bilateral upper extremity supported Standing balance-[name]: Poor Standing balance comment: dependent on RW for stability due to L LE TWB                             Pertinent Vitals/Pain Pain Assessment: 0-10 Pain Score: 8  Pain Location: L hip Pain Descriptors / Indicators: Constant Pain Intervention(s): Patient requesting pain meds-RN notified    Home Living Family/patient expects to be discharged to:: Private residence Living Arrangements: Spouse/significant other Available Help at Discharge: Family Type of Home: House Home Access: Stairs to enter Entrance Stairs-Rails: Can reach both Entrance Stairs-Number of Steps: 4 Home Layout: One level Home Equipment: Environmental consultant - 2 wheels;Cane - single point;Tub bench;Grab bars - toilet;Grab bars - tub/shower      Prior Function Level of Independence: Independent         Comments: worked as a Nurse, adult   Dominant Hand: Right    Extremity/Trunk Assessment   Upper Extremity Assessment Upper Extremity Assessment: Overall WFL for tasks assessed (however fatigues quickly with ambulation with L LE TWB)    Lower Extremity Assessment Lower Extremity Assessment:  LLE deficits/detail LLE Deficits / Details: able to initiate quad set and AA hip/knee rom in supine to about 35 deg    Cervical / Trunk Assessment Cervical / Trunk Assessment: Normal  Communication   Communication: No difficulties  Cognition Arousal/Alertness: Awake/alert Behavior During Therapy: WFL for tasks  assessed/performed Overall Cognitive Status: Within Functional Limits for tasks assessed                                        General Comments General comments (skin integrity, edema, etc.): L hip with dressing intact    Exercises Total Joint Exercises Ankle Circles/Pumps: AROM;Both;10 reps Quad Sets: AROM;Left;10 reps;Supine Gluteal Sets: AROM;Both;10 reps;Supine Heel Slides: AAROM;Left;10 reps;Supine   Assessment/Plan    PT Assessment Patient needs continued PT services  PT Problem List Decreased strength;Decreased range of motion;Decreased activity tolerance;Decreased balance;Decreased mobility;Decreased coordination;Decreased cognition;Decreased knowledge of use of DME;Decreased safety awareness;Pain       PT Treatment Interventions DME instruction;Stair training;Gait training;Functional mobility training;Therapeutic activities;Therapeutic exercise;Balance training;Neuromuscular re-education    PT Goals (Current goals can be found in the Care Plan section)  Acute Rehab PT Goals Patient Stated Goal: stop the pain and get on my bike in october PT Goal Formulation: With patient Time For Goal Achievement: 05/04/17 Potential to Achieve Goals: Good    Frequency 7X/week   Barriers to discharge        Co-evaluation               AM-PAC PT "6 Clicks" Daily Activity  Outcome Measure Difficulty turning over in bed (including adjusting bedclothes, sheets and blankets)?: A Lot Difficulty moving from lying on back to sitting on the side of the bed? : A Lot Difficulty sitting down on and standing up from a chair with arms (e.g., wheelchair, bedside commode, etc,.)?: A Little Help needed moving to and from a bed to chair (including a wheelchair)?: A Little Help needed walking in hospital room?: A Little Help needed climbing 3-5 steps with a railing? : A Lot 6 Click Score: 15    End of Session Equipment Utilized During Treatment: Gait belt Activity  Tolerance: Patient limited by pain Patient left: in chair;with call bell/phone within reach Nurse Communication: Mobility status PT Visit Diagnosis: Pain;Difficulty in walking, not elsewhere classified (R26.2) Pain - Right/Left: Left Pain - part of body: Hip    Time: 2248-2500 PT Time Calculation (min) (ACUTE ONLY): 33 min   Charges:   PT Evaluation $PT Eval Moderate Complexity: 1 Procedure PT Treatments $Gait Training: 8-22 mins   PT G Codes:        Kittie Plater, PT, DPT Pager #: [phone] Office #: [phone]   [place] 04/27/2017, 12:34 PM

## 2017-04-28 LAB — CBC
HEMATOCRIT: 35.7 % — AB (ref 39.0–52.0)
Hemoglobin: 12.1 g/dL — ABNORMAL LOW (ref 13.0–17.0)
MCH: 31.8 pg (ref 26.0–34.0)
MCHC: 33.9 g/dL (ref 30.0–36.0)
MCV: 93.7 fL (ref 78.0–100.0)
PLATELETS: 250 10*3/uL (ref 150–400)
RBC: 3.81 MIL/uL — ABNORMAL LOW (ref 4.22–5.81)
RDW: 13.6 % (ref 11.5–15.5)
WBC: 13.3 10*3/uL — AB (ref 4.0–10.5)

## 2017-04-28 LAB — BASIC METABOLIC PANEL
ANION GAP: 7 (ref 5–15)
BUN: 9 mg/dL (ref 6–20)
CALCIUM: 8.8 mg/dL — AB (ref 8.9–10.3)
CO2: 29 mmol/L (ref 22–32)
Chloride: 101 mmol/L (ref 101–111)
Creatinine, Ser: 0.96 mg/dL (ref 0.61–1.24)
GFR calc Af Amer: >60 mL/min (ref >60)
GLUCOSE: 118 mg/dL — AB (ref 65–99)
Potassium: 3.9 mmol/L (ref 3.5–5.1)
Sodium: 137 mmol/L (ref 135–145)

## 2017-04-28 NOTE — Progress Notes (Signed)
Physical Therapy Treatment Patient Details Name: Daniel [name] MRN: [id number] DOB: [birth year] Today's Date: 04/28/2017    History of Present Illness Daniel Karge [name] a 56 y.o.males/p L THA , posterior approach with precautions. PMH:  L hip AVN.    PT Comments    Pt performed increased mobility and reviewed stairs for safe entry into home.  Pt required review of Hip exercises.  PLan for pm to review standing exercises.  Will continue to recommend home at this time.    Follow Up Recommendations  Home health PT;Supervision - Intermittent     Equipment Recommendations  Rolling walker with 5" wheels;3in1 (PT) (Pt reports he already has RW at home )    Recommendations for Other Services       Precautions / Restrictions Precautions Precautions: Posterior Hip Precaution Booklet Issued: Yes (comment) Precaution Comments: Pt able to recall 3/3 hip precautions.   Restrictions Weight Bearing Restrictions: Yes LLE Weight Bearing: Touchdown weight bearing    Mobility  Bed Mobility               General bed mobility comments: In recliner upon arrival  Transfers Overall transfer level: Needs assistance Equipment used: Crutches Transfers: Sit to/from Stand Sit to Stand: Min guard         General transfer comment: v/c's for hand placement, pt able to maintain L LE TWB.  Requires assistance to hand him his crutches as her prefers to push from the seat with both hands.  Practiced with use of crutches and he is able to perform with min guard.    Ambulation/Gait Ambulation/Gait assistance: Min guard Ambulation Distance (Feet): 180 Feet Assistive device: Crutches Gait Pattern/deviations: Step-through pattern;Antalgic;Trunk flexed Gait velocity: variable Gait velocity interpretation: Below normal speed for age/gender General Gait Details: able to do TDWB on LLE, crutches were used with swing through pattern.  Pt reports feeling groggy during session.  Cues for upper  trunk control.     Stairs Stairs: Yes   Stair Management: Step to pattern;Forwards;With crutches Number of Stairs: 4 General stair comments: Cues for sequencing good ability to maintain L TDWB.    Wheelchair Mobility    Modified Rankin (Stroke Patients Only)       Balance Overall balance assessment: Needs assistance Sitting-balance support: Feet supported Sitting balance-[name]: Good     Standing balance support: Bilateral upper extremity supported Standing balance-[name]: Fair Standing balance comment: Pt able to perform static stance without device to pull up his shorts.                              Cognition Arousal/Alertness: Awake/alert Behavior During Therapy: WFL for tasks assessed/performed Overall Cognitive Status: Within Functional Limits for tasks assessed                                        Exercises Total Joint Exercises Ankle Circles/Pumps: AROM;Both;10 reps;Supine Quad Sets: AROM;Left;10 reps;Supine Gluteal Sets: AROM;Both;10 reps;Supine Short Arc Quad: AROM;Left;10 reps;Supine Heel Slides: AAROM;Left;10 reps;Supine Hip ABduction/ADduction: AROM;10 reps;Left;Supine Long Arc Quad: AROM;Left;10 reps;Seated    General Comments        Pertinent Vitals/Pain Pain Assessment: 0-10 Pain Score: 5  Pain Location: L hip Pain Descriptors / Indicators: Constant Pain Intervention(s): Monitored during session;Repositioned;Premedicated before session    Home Living  Prior Function            PT Goals (current goals can now be found in the care plan section) Acute Rehab PT Goals Patient Stated Goal: to get home and feel less pain Potential to Achieve Goals: Good Progress towards PT goals: Progressing toward goals    Frequency    7X/week      PT Plan Current plan remains appropriate    Co-evaluation              AM-PAC PT "6 Clicks" Daily Activity  Outcome  Measure  Difficulty turning over in bed (including adjusting bedclothes, sheets and blankets)?: A Little Difficulty moving from lying on back to sitting on the side of the bed? : A Little Difficulty sitting down on and standing up from a chair with arms (e.g., wheelchair, bedside commode, etc,.)?: A Little Help needed moving to and from a bed to chair (including a wheelchair)?: A Little Help needed walking in hospital room?: A Little Help needed climbing 3-5 steps with a railing? : A Little 6 Click Score: 18    End of Session Equipment Utilized During Treatment: Gait belt Activity Tolerance: Patient limited by pain Patient left: in chair;with call bell/phone within reach Nurse Communication: Mobility status PT Visit Diagnosis: Pain;Difficulty in walking, not elsewhere classified (R26.2) Pain - Right/Left: Left Pain - part of body: Hip     Time: 5732-2025 PT Time Calculation (min) (ACUTE ONLY): 20 min  Charges:  $Gait Training: 8-22 mins                    G Codes:       Governor Rooks, PTA pager [place] 04/28/2017, 1:58 PM

## 2017-04-28 NOTE — Plan of Care (Signed)
Problem: Safety: Goal: Ability to remain free from injury will improve Belongings are in reach. Patient voices understanding to call for assistance.

## 2017-04-28 NOTE — Care Management Note (Signed)
Case Management Note  Patient Details  Name: Daniel [name] MRN: [id number] Date of Birth: 1961-03-07  Subjective/Objective:   56 yr old gentleman s/p left total hip arthroplasty, posterior approach.                  Action/Plan: Case manager spoke with patient and wife concerning discharge plan and DME needs. Patient was preoperatively setup with [facility], no changes. Patient says he will be using crutches at discharge, his wife has them in the car. Patient will have family support at discharge. No further CM needs identified.    Expected Discharge Date:   04/29/17               Expected Discharge Plan:  [place]  In-House Referral:  NA  Discharge planning Services  CM Consult  Post Acute Care Choice:  Home Health Choice offered to:  Patient  DME Arranged:  N/A (has crutches) DME Agency:  NA  HH Arranged:  PT HH Agency:  [facility] (formerly [employer])  Status of Service:  Completed, signed off  If discussed at [employer], dates discussed:    Additional Comments:  Ninfa Meeker, RN 04/28/2017, 3:21 PM

## 2017-04-28 NOTE — Progress Notes (Signed)
Patient ID: Daniel [name], male   DOB: [birth year], 56 y.o.   MRN: [id number]     Subjective:  Patient reports pain as mild.  Patient in the chair and in no acute distress.  Objective:   VITALS:   Vitals:   04/27/17 0500 04/27/17 1500 04/27/17 2223 04/28/17 0442  BP: 115/78 (!) 111/94 112/68 126/84  Pulse: (!) 101 (!) 58 90 100  Resp: 16 16 16 17   Temp: 99.3 F (37.4 C) 98.1 F (36.7 C) 98.8 F (37.1 C) 97.6 F (36.4 C)  TempSrc: Oral Oral Oral Axillary  SpO2: 95% 95% 97% 99%  Weight:        ABD soft Sensation intact distally Dorsiflexion/Plantar flexion intact Incision: dressing C/D/I and no drainage   Lab Results  Component Value Date   WBC 13.3 (H) 04/28/2017   HGB 12.1 (L) 04/28/2017   HCT 35.7 (L) 04/28/2017   MCV 93.7 04/28/2017   PLT 250 04/28/2017   BMET    Component Value Date/Time   NA 137 04/28/2017 0542   K 3.9 04/28/2017 0542   CL 101 04/28/2017 0542   CO2 29 04/28/2017 0542   GLUCOSE 118 (H) 04/28/2017 0542   BUN 9 04/28/2017 0542   CREATININE 0.96 04/28/2017 0542   CALCIUM 8.8 (L) 04/28/2017 0542   GFRNONAA >60 04/28/2017 0542   GFRAA >60 04/28/2017 0542     Assessment/Plan: 2 Days Post-Op   Principal Problem:   Avascular necrosis of bone of hip, left (HCC) Active Problems:   Avascular necrosis of bone of left hip (HCC)   Advance diet Up with therapy Plan for discharge tomorrow TTWB left lower ext Dry dressing PRN   DOUGLAS PARRY, BRANDON 04/28/2017, 1:23 PM  Discussed and agree with above.   Marchia Bond, MD Cell [phone]

## 2017-04-28 NOTE — Progress Notes (Signed)
Physical Therapy Treatment Patient Details Name: Daniel [name] MRN: [id number] DOB: [birth year] Today's Date: 04/28/2017    History of Present Illness Daniel [name] a 56 y.o.males/p L THA , posterior approach with precautions. PMH:  L hip AVN.    PT Comments    Patient progressing with ambulation and able to work toward more normalized gait pattern, though some wooziness and nausea due to pain medication.  Will need HHPT at d/c.    Follow Up Recommendations  Home health PT;Supervision - Intermittent     Equipment Recommendations  3in1 (PT)    Recommendations for Other Services       Precautions / Restrictions Precautions Precautions: Posterior Hip Precaution Booklet Issued: Yes (comment) Precaution Comments: Pt able to recall 3/3 hip precautions.   Restrictions Weight Bearing Restrictions: Yes LLE Weight Bearing: Touchdown weight bearing    Mobility  Bed Mobility               General bed mobility comments: In recliner upon arrival  Transfers Overall transfer level: Needs assistance Equipment used: Crutches Transfers: Sit to/from Stand Sit to Stand: Supervision         General transfer comment: with assist to hold crutches   Ambulation/Gait Ambulation/Gait assistance: Min guard;Supervision Ambulation Distance (Feet): 180 Feet Assistive device: Crutches Gait Pattern/deviations: Step-through pattern;Decreased dorsiflexion - left Gait velocity: variable Gait velocity interpretation: Below normal speed for age/gender General Gait Details: dragging L toes on ground with swing through gait with crutches, cues for step-step with TDWB on L and performed for few steps with pain upon L LE extension   Stairs Stairs: Yes   Stair Management: Step to pattern;Forwards;With crutches Number of Stairs: 4 General stair comments: Cues for sequencing good ability to maintain L TDWB.    Wheelchair Mobility    Modified Rankin (Stroke Patients Only)        Balance Overall balance assessment: Needs assistance Sitting-balance support: Feet supported Sitting balance-[name]: Good     Standing balance support: Bilateral upper extremity supported Standing balance-[name]: Fair Standing balance comment: Pt able to perform static stance without device to pull up his shorts.                              Cognition Arousal/Alertness: Awake/alert Behavior During Therapy: WFL for tasks assessed/performed Overall Cognitive Status: Within Functional Limits for tasks assessed                                        Exercises Total Joint Exercises Ankle Circles/Pumps: AROM;Both;10 reps;Supine Quad Sets: AROM;Left;10 reps;Supine Gluteal Sets: AROM;Both;10 reps;Supine Short Arc Quad: AROM;Left;10 reps;Supine Heel Slides: AAROM;Left;10 reps;Supine Hip ABduction/ADduction: AROM;Left;5 reps;Standing Long Arc Quad: AROM;Left;10 reps;Seated Knee Flexion: AROM;Left;5 reps;Standing Marching in Standing: AROM;Left;5 reps;Standing Standing Hip Extension: AROM;Left;5 reps;Standing    General Comments        Pertinent Vitals/Pain Pain Assessment: 0-10 Pain Score: 5  Pain Location: L hip Pain Descriptors / Indicators: Constant Pain Intervention(s): Monitored during session;Premedicated before session;Repositioned    Home Living                      Prior Function            PT Goals (current goals can now be found in the care plan section) Acute Rehab PT Goals Patient Stated Goal: to get  home and feel less pain Potential to Achieve Goals: Good Progress towards PT goals: Progressing toward goals    Frequency    7X/week      PT Plan Current plan remains appropriate    Co-evaluation              AM-PAC PT "6 Clicks" Daily Activity  Outcome Measure  Difficulty turning over in bed (including adjusting bedclothes, sheets and blankets)?: A Little Difficulty moving from lying on  back to sitting on the side of the bed? : A Little Difficulty sitting down on and standing up from a chair with arms (e.g., wheelchair, bedside commode, etc,.)?: A Little Help needed moving to and from a bed to chair (including a wheelchair)?: A Little Help needed walking in hospital room?: A Little Help needed climbing 3-5 steps with a railing? : A Little 6 Click Score: 18    End of Session Equipment Utilized During Treatment: Gait belt Activity Tolerance: Patient limited by pain Patient left: in chair;with call bell/phone within reach Nurse Communication: Mobility status PT Visit Diagnosis: Pain;Difficulty in walking, not elsewhere classified (R26.2) Pain - Right/Left: Left Pain - part of body: Hip     Time: 2951-8841 PT Time Calculation (min) (ACUTE ONLY): 12 min  Charges:  $Gait Training: 8-22 mins                    G CodesMagda Kiel, [place] 04/28/2017    Reginia Naas 04/28/2017, 5:00 PM

## 2017-04-29 ENCOUNTER — Encounter (HOSPITAL_COMMUNITY): Payer: Self-pay | Admitting: Orthopedic Surgery

## 2017-04-29 LAB — CBC
HEMATOCRIT: 35.2 % — AB (ref 39.0–52.0)
HEMOGLOBIN: 11.5 g/dL — AB (ref 13.0–17.0)
MCH: 31.3 pg (ref 26.0–34.0)
MCHC: 32.7 g/dL (ref 30.0–36.0)
MCV: 95.7 fL (ref 78.0–100.0)
PLATELETS: 264 10*3/uL (ref 150–400)
RBC: 3.68 MIL/uL — AB (ref 4.22–5.81)
RDW: 13.6 % (ref 11.5–15.5)
WBC: 7.1 10*3/uL (ref 4.0–10.5)

## 2017-04-29 NOTE — Discharge Summary (Signed)
Physician Discharge Summary  Patient ID: Daniel [name] MRN: [id number] DOB/AGE: Dec 19, 1960 56 y.o.  Admit date: 04/26/2017 Discharge date: 04/29/2017  Admission Diagnoses:  Avascular necrosis of bone of hip, left [facility])  Discharge Diagnoses:  Principal Problem:   Avascular necrosis of bone of hip, left ([place]) Active Problems:   Avascular necrosis of bone of left hip [facility])   Past Medical History:  Diagnosis Date  . Abrasion of hand, right 08/26/2014  . Anxiety   . Arthritis   . Avascular necrosis of bone of hip, left ([place]) 04/26/2017  . Cubital tunnel syndrome on right 08/2014  . Dental bridge present    lower left  . Dental crown present    lower right  . GERD (gastroesophageal reflux disease)   . PONV (postoperative nausea and vomiting)   . 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 male who was admitted 04/26/2017 with a diagnosis of Avascular necrosis of bone of hip, left ([place]) and went to the operating room on 04/26/2017 and underwent the above named procedures.    He was given perioperative antibiotics:  Anti-infectives    Start     Dose/Rate Route Frequency Ordered Stop   04/26/17 1400  ceFAZolin (ANCEF) IVPB 2g/100 mL premix     2 g 200 mL/hr over 30 Minutes Intravenous Every 6 hours 04/26/17 1313 04/26/17 2104   04/26/17 0613  ceFAZolin (ANCEF) 2-4 GM/100ML-% IVPB    Comments:  Daniel [name]   : cabinet override      04/26/17 0613 04/26/17 0747   04/26/17 0611  ceFAZolin (ANCEF) IVPB 2g/100 mL premix     2 g 200 mL/hr over 30 Minutes Intravenous On call to O.R. 04/26/17 8144 04/26/17 0757    .  He was given sequential compression devices, early ambulation, and xarelto for DVT prophylaxis.  He benefited maximally from the hospital stay and there were no complications.    Recent  vital signs:  Vitals:   04/28/17 2038 04/29/17 0629  BP: 113/67 109/74  Pulse: 99 90  Resp: 17 16  Temp: 98.7 F (37.1 C) 98.4 F (36.9 C)    Recent laboratory studies:  Lab Results  Component Value Date   HGB 11.5 (L) 04/29/2017   HGB 12.1 (L) 04/28/2017   HGB 12.2 (L) 04/27/2017   Lab Results  Component Value Date   WBC 7.1 04/29/2017   PLT 264 04/29/2017   Lab Results  Component Value Date   INR 0.97 08/13/2014   Lab Results  Component Value Date   NA 137 04/28/2017   K 3.9 04/28/2017   CL 101 04/28/2017   CO2 29 04/28/2017   BUN 9 04/28/2017   CREATININE 0.96 04/28/2017   GLUCOSE 118 (H) 04/28/2017    Discharge Medications:   Allergies as of 04/29/2017      Reactions   Vicodin [hydrocodone-acetaminophen] Itching      Medication List    STOP taking these medications   aspirin EC 81 MG tablet   ibuprofen 200 MG tablet Commonly known as:  ADVIL,MOTRIN     TAKE these medications   baclofen 10 MG tablet Commonly known as:  LIORESAL Take 1 tablet (10 mg total) by mouth 3 (three) times daily. As needed for muscle spasm   citalopram 40 MG tablet Commonly known as:  CELEXA Take 40  mg by mouth at bedtime.   lansoprazole 15 MG capsule Commonly known as:  PREVACID Take 15 mg by mouth daily at 12 noon.   ondansetron 4 MG tablet Commonly known as:  ZOFRAN Take 1 tablet (4 mg total) by mouth every 8 (eight) hours as needed for nausea or vomiting.   oxyCODONE 5 MG immediate release tablet Commonly known as:  ROXICODONE Take 1-2 tablets (5-10 mg total) by mouth every 4 (four) hours as needed for severe pain.   rivaroxaban 10 MG Tabs tablet Commonly known as:  XARELTO Take 1 tablet (10 mg total) by mouth daily.   sennosides-docusate sodium 8.6-50 MG tablet Commonly known as:  SENOKOT-S Take 2 tablets by mouth daily.       Diagnostic Studies: Dg Pelvis Portable  Result Date: 04/26/2017 CLINICAL DATA:  Left total hip arthroplasty EXAM: PORTABLE  PELVIS 1-2 VIEWS COMPARISON:  02/19/2017 FINDINGS: Left total hip arthroplasty has been placed. No breakage or loosening of the hardware. Anatomic alignment. IMPRESSION: Left total hip arthroplasty anatomically aligned. Electronically Signed   By: Daniel [name] M.D.   On: 04/26/2017 10:39   Dg Pelvis Comp Min 3v  Result Date: 04/27/2017 CLINICAL DATA:  Status post left total hip joint prosthesis placement yesterday. EXAM: JUDET PELVIS - 3+ VIEW COMPARISON:  AP portable postoperative view dated April 26, 2017 FINDINGS: The bones are subjectively adequately mineralized. The left hip joint prosthesis appears to be in reasonable position. The interface with the native bone appears normal. The tip of the its femoral stem of the prosthesis is not included in the field of view however. There is symmetric narrowing of the right hip joint space. The bony pelvis exhibits no acute lytic or blastic lesion. IMPRESSION: Status post left total hip joint prosthesis placement yesterday. No postprocedure complication. Electronically Signed   By: Daniel  [name] M.D.   On: 04/27/2017 10:48   Dg Hip Port Unilat With Pelvis 1v Left  Result Date: 04/26/2017 CLINICAL DATA:  Left total hip arthroplasty. EXAM: DG HIP (WITH OR WITHOUT PELVIS) 1V PORT LEFT COMPARISON:  None. FINDINGS: Lateral portable view demonstrates that the components of the total hip prosthesis appear in excellent position in the lateral projection. No fracture. IMPRESSION: Satisfactory appearance of the left hip in the lateral projection after total hip prosthesis insertion. Electronically Signed   By: Daniel [name] M.D.   On: 04/26/2017 10:39    Disposition: 01-Home or Self Care    Follow-up Information    Daniel Bond, MD. Schedule an appointment as soon as possible for a visit in 2 week(s).   Specialty:  Orthopedic Surgery Contact information: [address] [place] [address] [phone]        [facility], [facility] Follow up.    Specialty:  [place] Why:  A representative from [facility] will contact you to arrange start date and time for your therapy. Contact information: [address] [place] [address] [phone]            Signed: Johnny Bridge 04/29/2017, 2:41 PM

## 2017-04-29 NOTE — Plan of Care (Signed)
Problem: Safety: Goal: Ability to remain free from injury will improve Call bell and personal belongings within reach.  Patient voices understanding to call for assistance.   

## 2017-04-29 NOTE — Progress Notes (Signed)
Patient ID: Daniel [name], male   DOB: Jan 05, 1961, 56 y.o.   MRN: [id number]     Subjective:  Patient reports pain as mild.  Patient requesting to go home.  Denies any CP or SOB  Objective:   VITALS:   Vitals:   04/28/17 0442 04/28/17 1700 04/28/17 2038 04/29/17 0629  BP: 126/84 125/82 113/67 109/74  Pulse: 100 95 99 90  Resp: 17 18 17 16   Temp: 97.6 F (36.4 C) 98.5 F (36.9 C) 98.7 F (37.1 C) 98.4 F (36.9 C)  TempSrc: Axillary Oral Oral Oral  SpO2: 99% 100% 100% 96%  Weight:        ABD soft Sensation intact distally Dorsiflexion/Plantar flexion intact Incision: dressing C/D/I and no drainage   Lab Results  Component Value Date   WBC 7.1 04/29/2017   HGB 11.5 (L) 04/29/2017   HCT 35.2 (L) 04/29/2017   MCV 95.7 04/29/2017   PLT 264 04/29/2017   BMET    Component Value Date/Time   NA 137 04/28/2017 0542   K 3.9 04/28/2017 0542   CL 101 04/28/2017 0542   CO2 29 04/28/2017 0542   GLUCOSE 118 (H) 04/28/2017 0542   BUN 9 04/28/2017 0542   CREATININE 0.96 04/28/2017 0542   CALCIUM 8.8 (L) 04/28/2017 0542   GFRNONAA >60 04/28/2017 0542   GFRAA >60 04/28/2017 0542     Assessment/Plan: 3 Days Post-Op   Principal Problem:   Avascular necrosis of bone of hip, left (H[facility]) Active Problems:   Avascular necrosis of bone of left hip ([facility])   Advance diet Up with therapy Discharge home with home health TTWB left lower ext Dry dressing PRN Follow up as scheduled   DOUGLAS PARRY, BRANDON 04/29/2017, 10:06 AM  Discussed and agree with above.   Marchia Bond, MD Cell [phone]

## 2017-04-29 NOTE — Progress Notes (Signed)
Physical Therapy Treatment Patient Details Name: Daniel [name] MRN: [id number] DOB: 1961/08/06 Today's Date: 04/29/2017    History of Present Illness Reggie Bise [name] a 56 y.o.males/p L THA , posterior approach with precautions. PMH:  L hip AVN.    PT Comments    Pt performed seated, supine and standing exercises in prep for d/c home and carryover with HEP.  Pt performing a more normalized gait pattern during session.  OT to dovetail tx during session.  PT left in rehab gym with OT at conclusion of session.  Informed RN that patient is ready for d/c home.  Pt reports his ride will be here around 1 pm.     Follow Up Recommendations  Home health PT;Supervision - Intermittent     Equipment Recommendations  3in1 (PT)    Recommendations for Other Services       Precautions / Restrictions Precautions Precautions: Posterior Hip Precaution Booklet Issued: Yes (comment) Precaution Comments: Pt able to recall 3/3 hip precautions.   Restrictions Weight Bearing Restrictions: Yes LLE Weight Bearing: Touchdown weight bearing    Mobility  Bed Mobility Overal bed mobility: Needs Assistance Bed Mobility: Supine to Sit     Supine to sit: Supervision     General bed mobility comments: Increased time and guard due to pain but no assistance needed.   Transfers Overall transfer level: Needs assistance Equipment used: Crutches Transfers: Sit to/from Stand Sit to Stand: Supervision         General transfer comment: with assist to hold crutches, but no assistance to stand.   Ambulation/Gait Ambulation/Gait assistance: Supervision Ambulation Distance (Feet): 50 Feet (down to gym to work with OT on tub transfer.  ) Assistive device: Crutches Gait Pattern/deviations: Step-through pattern;Decreased dorsiflexion - left Gait velocity: variable Gait velocity interpretation: Below normal speed for age/gender General Gait Details: Pt with more normalized gait pattern with better  carryover to TWB vs non weight bearing and dragging his toe.     Stairs            Wheelchair Mobility    Modified Rankin (Stroke Patients Only)       Balance Overall balance assessment: Needs assistance Sitting-balance support: Feet supported Sitting balance-[name]: Good     Standing balance support: Bilateral upper extremity supported Standing balance-[name]: Fair Standing balance comment: Pt able to perform static stance without device to pull up his shorts.                              Cognition Arousal/Alertness: Awake/alert Behavior During Therapy: WFL for tasks assessed/performed Overall Cognitive Status: Within Functional Limits for tasks assessed                                        Exercises Total Joint Exercises Ankle Circles/Pumps: AROM;Both;10 reps;Supine Quad Sets: AROM;Left;10 reps;Supine Short Arc Quad: AROM;Left;10 reps;Supine Heel Slides: AAROM;Left;10 reps;Supine Hip ABduction/ADduction: AROM;Left;Standing;AAROM;20 reps;Supine (1x10 supine with intial AAROM and progressing to AROM,  1x10 in standing.  ) Long Arc Quad: AROM;Left;10 reps;Seated Knee Flexion: AROM;Left;10 reps;Standing Marching in Standing: AROM;Left;Standing;10 reps Standing Hip Extension: AROM;Left;Standing;10 reps    General Comments        Pertinent Vitals/Pain Pain Assessment: 0-10 Pain Score: 5  Faces Pain Scale: Hurts even more Pain Location: L hip Pain Descriptors / Indicators: Constant Pain Intervention(s): Monitored during session;Repositioned  Home Living                      Prior Function            PT Goals (current goals can now be found in the care plan section) Acute Rehab PT Goals Patient Stated Goal: to get home and feel less pain Potential to Achieve Goals: Good Progress towards PT goals: Progressing toward goals    Frequency    7X/week      PT Plan Current plan remains appropriate     Co-evaluation              AM-PAC PT "6 Clicks" Daily Activity  Outcome Measure  Difficulty turning over in bed (including adjusting bedclothes, sheets and blankets)?: None Difficulty moving from lying on back to sitting on the side of the bed? : None Difficulty sitting down on and standing up from a chair with arms (e.g., wheelchair, bedside commode, etc,.)?: A Little Help needed moving to and from a bed to chair (including a wheelchair)?: A Little Help needed walking in hospital room?: A Little Help needed climbing 3-5 steps with a railing? : A Little 6 Click Score: 20    End of Session Equipment Utilized During Treatment: Gait belt Activity Tolerance: Patient limited by pain Patient left: in chair;with call bell/phone within reach Nurse Communication: Mobility status PT Visit Diagnosis: Pain;Difficulty in walking, not elsewhere classified (R26.2) Pain - Right/Left: Left Pain - part of body: Hip     Time: 4782-9562 PT Time Calculation (min) (ACUTE ONLY): 23 min  Charges:  $Therapeutic Exercise: 23-37 mins                    G CodesGovernor Rooks, PTA pager [phone]    Cristela Blue 04/29/2017, 10:53 AM

## 2017-04-29 NOTE — Progress Notes (Signed)
Occupational Therapy Treatment and Discharge Patient Details Name: Daniel [name] MRN: [id number] DOB: 31-Aug-1961 Today's Date: 04/29/2017    History of present illness Daniel Alipio [name] a 56 y.o.males/p L THA , posterior approach with precautions. PMH:  L hip AVN.   OT comments  This 56 yo male admitted and underwent above presents to acute OT with education on tub transfers to 3n1 completed today. No further OT needs, we will D/C pt and he is to D/C home today as well.   Follow Up Recommendations  No OT follow up;Supervision - Intermittent          Precautions / Restrictions Precautions Precautions: Posterior Hip Precaution Booklet Issued: Yes (comment) Precaution Comments: Pt able to recall 3/3 hip precautions.   Restrictions Weight Bearing Restrictions: Yes LLE Weight Bearing: Touchdown weight bearing       Mobility Bed Mobility     General bed mobility comments: pt up headed to gym with PT  Transfers Overall transfer level: Needs assistance Equipment used: Crutches Transfers: Sit to/from Stand Sit to Stand: Supervision         General transfer comment: with assist to hold crutches, but no assistance to stand.     Balance Overall balance assessment: Needs assistance Sitting-balance support: Feet supported Sitting balance-[name]: Good     Standing balance support: Bilateral upper extremity supported Standing balance-[name]: Fair Standing balance comment: Pt able to perform static stance without device to pull up his shorts.                             ADL either performed or assessed with clinical judgement   ADL Overall ADL's : Needs assistance/impaired                                 Tub/ Shower Transfer: Tub transfer;Ambulation;3 in 1;Supervision/safety (crutches) Tub/Shower Transfer Details (indicate cue type and reason): Pt able with crutches to hop over tub while maintaining TTWB and transfer to 3n1 while  maintaining posterior hip precautions         Vision Patient Visual Report: No change from baseline            Cognition Arousal/Alertness: Awake/alert Behavior During Therapy: WFL for tasks assessed/performed Overall Cognitive Status: Within Functional Limits for tasks assessed                                                     Pertinent Vitals/ Pain       Pain Assessment: 0-10 Pain Score: 5  Faces Pain Scale: Hurts even more Pain Location: L hip Pain Descriptors / Indicators: Constant Pain Intervention(s): Monitored during session;Repositioned;Ice applied         Frequency  Min 2X/week        Progress Toward Goals  OT Goals(current goals can now be found in the care plan section)  Progress towards OT goals:  (all education completed)  Acute Rehab OT Goals Patient Stated Goal: to get home and feel less pain  Plan Discharge plan remains appropriate       AM-PAC PT "6 Clicks" Daily Activity     Outcome Measure   Help from another person eating meals?: None Help from another person taking care of  personal grooming?: None Help from another person toileting, which includes using toliet, bedpan, or urinal?: A Little Help from another person bathing (including washing, rinsing, drying)?: A Little Help from another person to put on and taking off regular upper body clothing?: None Help from another person to put on and taking off regular lower body clothing?: A Little 6 Click Score: 21    End of Session Equipment Utilized During Treatment:  (crutches)  OT Visit Diagnosis: Unsteadiness on feet (R26.81);Pain Pain - Right/Left: Left Pain - part of body: Hip   Activity Tolerance Patient tolerated treatment well   Patient Left in chair;with call bell/phone within reach   Nurse Communication  (pt ready to go from therapy standpoint and he is asking when he will be D/C'd)        Time: 3817-7116 OT Time Calculation (min): 6  min  Charges: OT General Charges $OT Visit: 1 Procedure  [place], OTR/L [phone] 04/29/2017

## 2017-04-29 NOTE — Progress Notes (Signed)
Reviewed discharge information/medications with patient.  Answered his questions.  Patient is stable and ready for discharge.  Patient is waiting on ride.

## 2017-04-29 NOTE — Progress Notes (Signed)
PT Cancellation Note  Patient Details Name: Daniel [name] MRN: [id number] DOB: 16-Jun-1961   Cancelled Treatment:    Reason Eval/Treat Not Completed: (P) Other (comment) (Pt reports he has just had medication and to come back in an hour.  )   Veyda Kaufman Eli Hose 04/29/2017, 9:20 AM  Governor Rooks, PTA pager [phone]

## 2018-12-09 ENCOUNTER — Other Ambulatory Visit: Payer: Self-pay | Admitting: Internal Medicine

## 2018-12-09 DIAGNOSIS — E785 Hyperlipidemia, unspecified: Secondary | ICD-10-CM

## 2018-12-25 ENCOUNTER — Ambulatory Visit
Admission: RE | Admit: 2018-12-25 | Discharge: 2018-12-25 | Disposition: A | Discharge status: Home / Self Care | Payer: PRIVATE HEALTH INSURANCE | Source: Ambulatory Visit | Attending: Internal Medicine | Admitting: Internal Medicine

## 2018-12-25 DIAGNOSIS — E785 Hyperlipidemia, unspecified: Secondary | ICD-10-CM

## 2018-12-25 IMAGING — CT CT HEART SCORING
3 series · 13 of 20 positions shown, 15 images · non-contrast
Comparison: None.

CLINICAL DATA: High cholesterol. Ex-smoker. Family history of heart
disease.

EXAM:
CT HEART FOR CALCIUM SCORING
TECHNIQUE: CT heart was performed using prospective ECG gating.
A non-contrast exam for calcium scoring was performed.
Note that this exam targets the heart and the chest was not imaged
in its entirety.

[Series 2: calcium scoring 2.00 qr36 bestdiast 69% · axial · 0.33mm/px · z∈[+1586,+1642]mm · 3 of 70 slices shown]
[im 14/70  vessel]
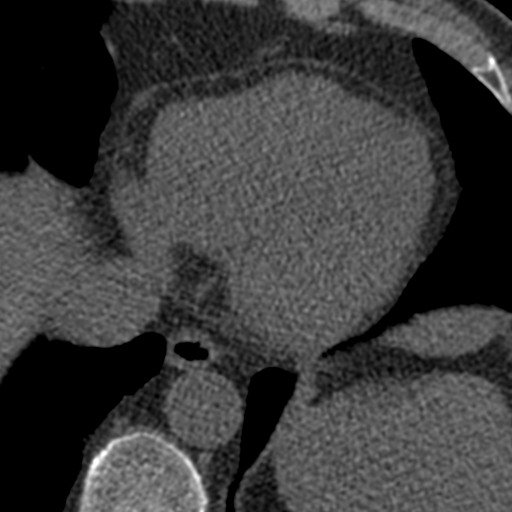
[im 28/70  vessel]
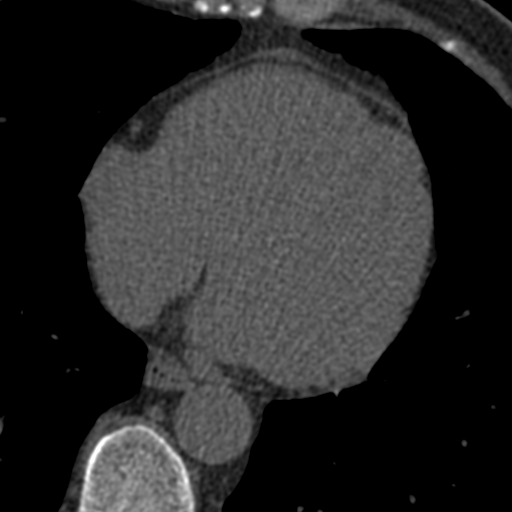
[im 42/70  vessel]
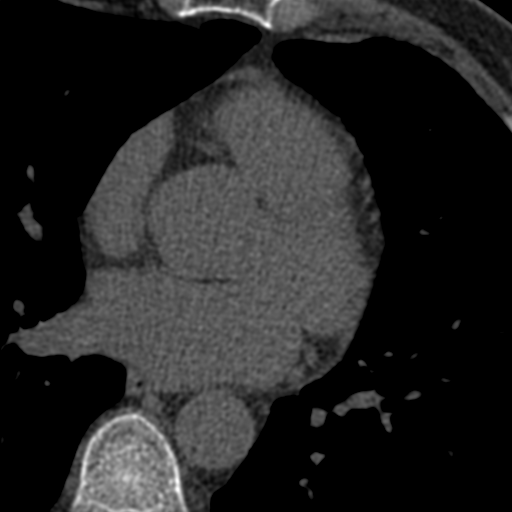

[Series 4: calcium scoring 2.00 br40 bestdiast 69% ax fov · axial · 0.51mm/px · z∈[+1582,+1674]mm · 5 of 70 slices shown, 7 images]
[im 12/70  vessel]
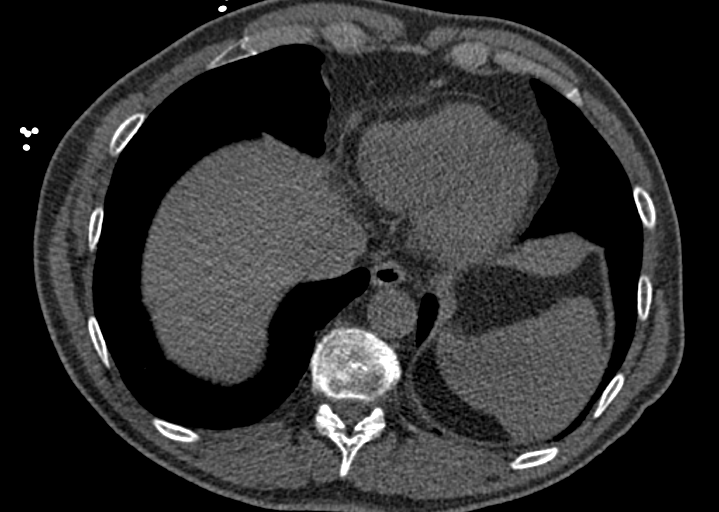
[im 12/70  lung]
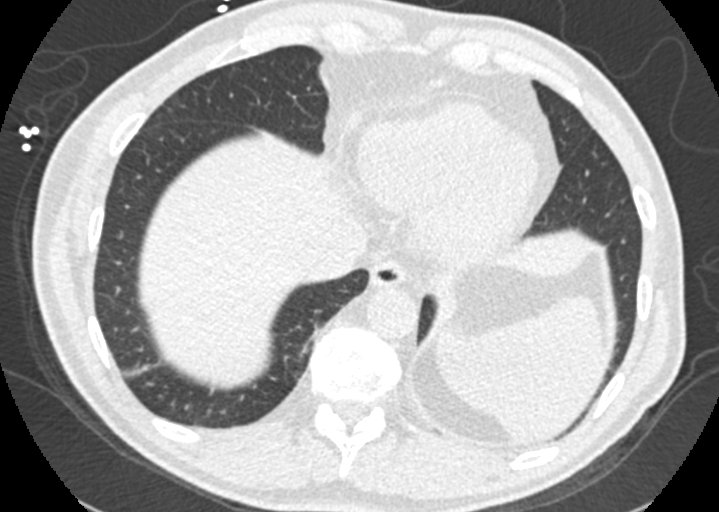
[im 24/70  vessel]
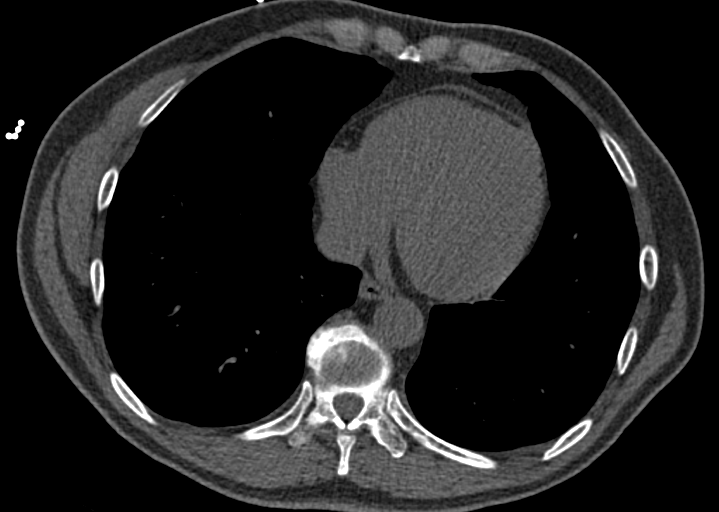
[im 35/70  vessel]
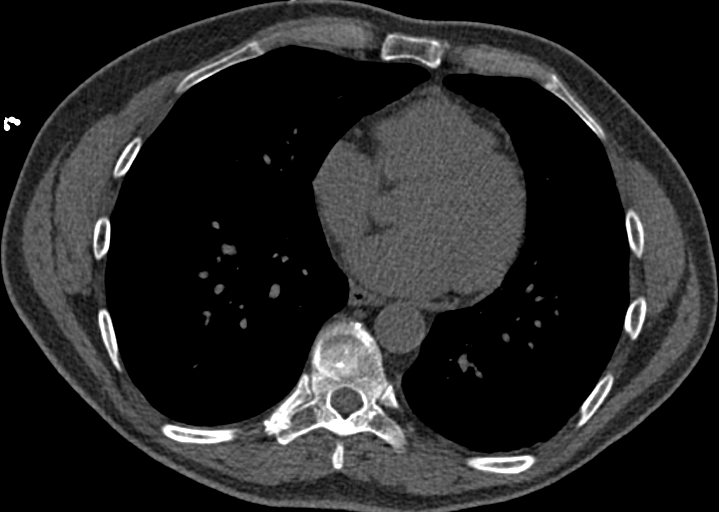
[im 47/70  vessel]
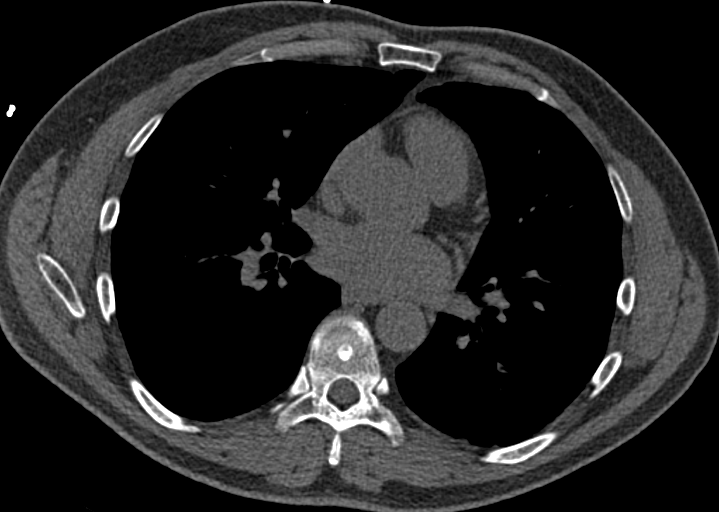
[im 58/70  vessel]
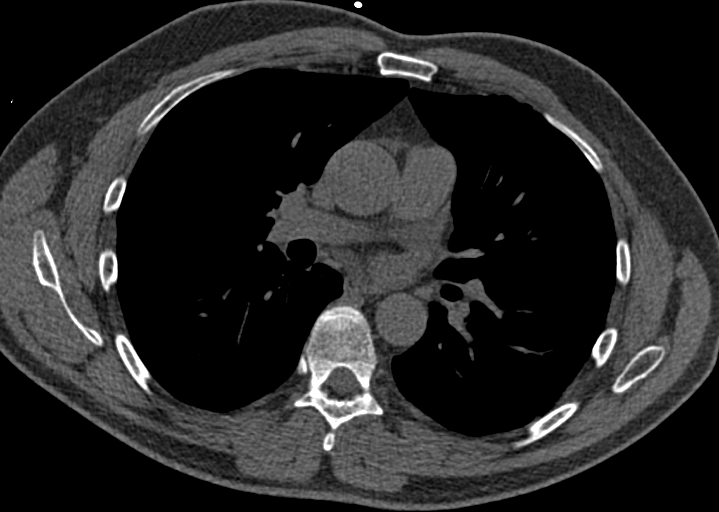
[im 58/70  lung]
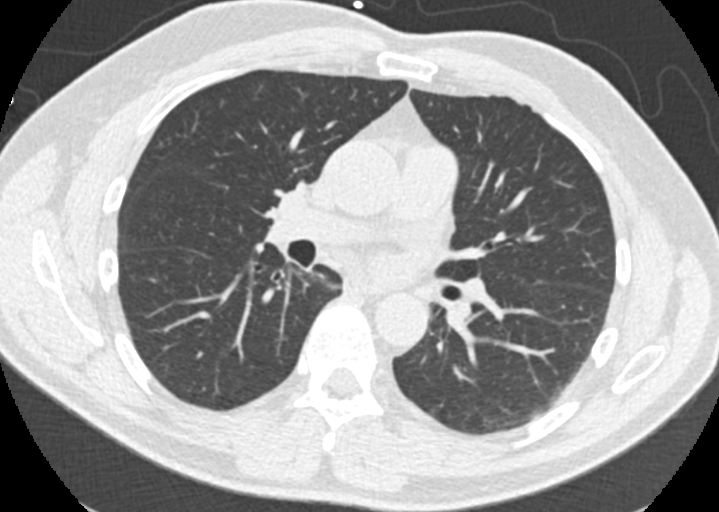

[Series 10: calcium scoring 2.00 br60 bestdiast 69% ax fov · axial · 0.51mm/px · z∈[+1582,+1674]mm · 5 of 70 slices shown]
[im 12/70  vessel]
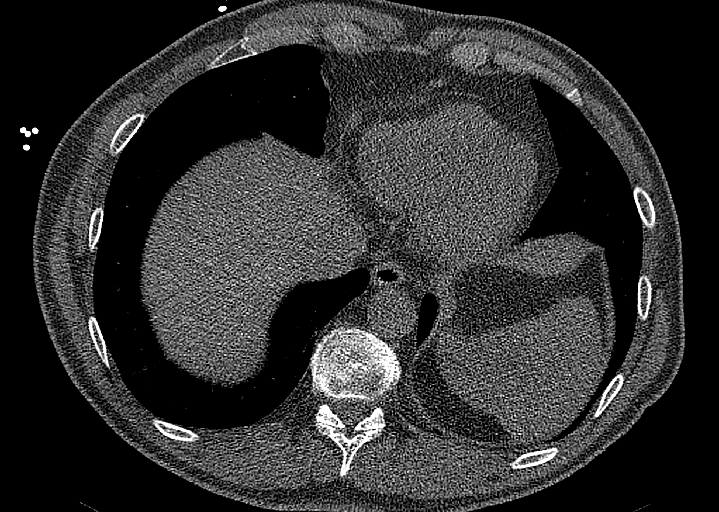
[im 24/70  vessel]
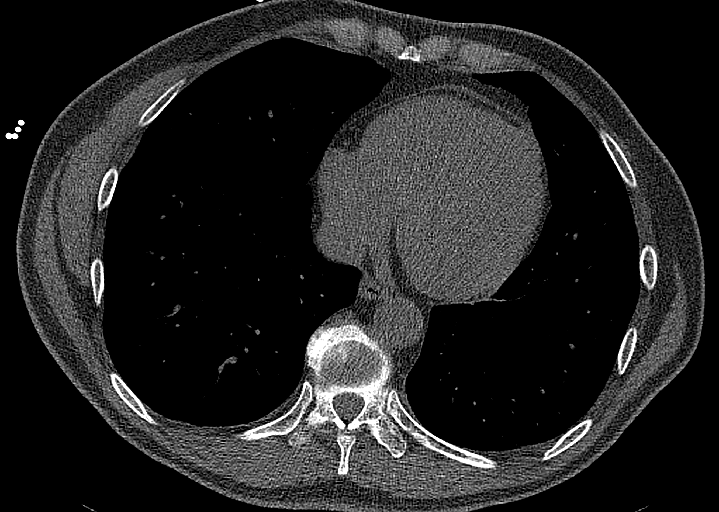
[im 35/70  vessel]
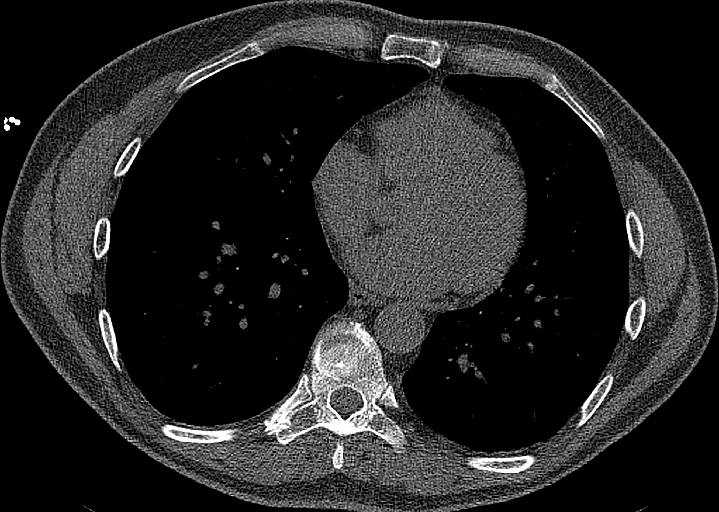
[im 47/70  vessel]
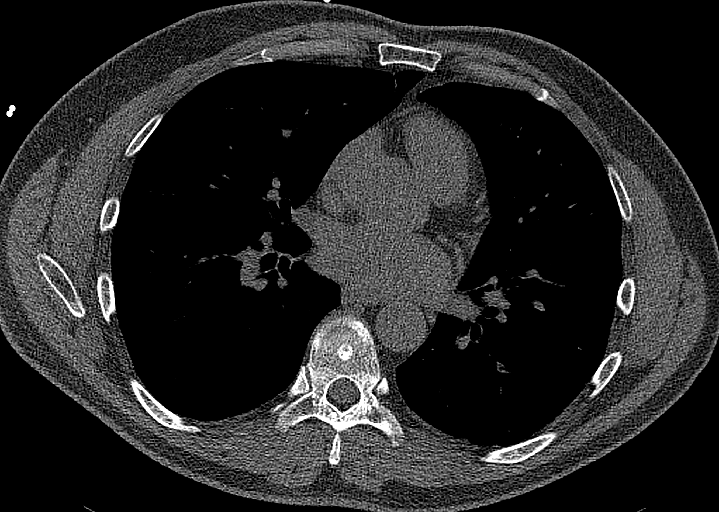
[im 58/70  vessel]
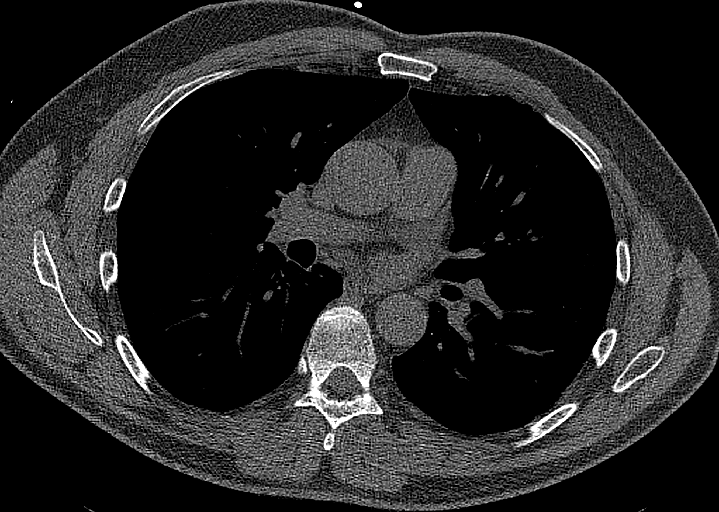

[13 of 20 positions shown; findings below may reference images not displayed]

FINDINGS: Technical quality: Good.

CORONARY CALCIUM

Total Agatston Score: 57.9-lad and probable distal left main
coronary calcification identified, including on image [DATE].

[HOSPITAL] percentile:  67th

OTHER FINDINGS:

Cardiovascular: Normal aortic caliber. Normal heart size, without
pericardial effusion.

Mediastinum/Nodes: No imaged thoracic adenopathy.

Lungs/Pleura: No imaged pleural fluid. A right upper lobe 4 mm
perifissural nodule on image [DATE].

Right lower lobe pleural-based 4 mm nodule on image [DATE].

Upper Abdomen: Normal imaged portions of the liver, spleen, stomach.

Musculoskeletal: No acute osseous abnormality.
IMPRESSION: 1. Total Agatston score of 57.9, corresponding to 67th percentile
for age, sex, and race based cohort.
2. Right-sided pulmonary nodules of up to 4 mm. No follow-up needed
if patient is low-risk. Non-contrast chest CT can be considered in
12 months if patient is high-risk. This recommendation follows the
consensus statement: Guidelines for Management of Incidental
Pulmonary Nodules Detected on CT Images: From the [HOSPITAL]

## 2020-01-14 ENCOUNTER — Other Ambulatory Visit: Payer: Self-pay | Admitting: Internal Medicine

## 2020-01-14 DIAGNOSIS — M542 Cervicalgia: Secondary | ICD-10-CM

## 2020-01-14 DIAGNOSIS — M5410 Radiculopathy, site unspecified: Secondary | ICD-10-CM

## 2020-02-07 ENCOUNTER — Other Ambulatory Visit: Payer: Self-pay

## 2020-02-07 ENCOUNTER — Ambulatory Visit
Admission: RE | Admit: 2020-02-07 | Discharge: 2020-02-07 | Disposition: A | Discharge status: Home / Self Care | Payer: Medicare PPO | Source: Ambulatory Visit | Attending: Internal Medicine | Admitting: Internal Medicine

## 2020-02-07 DIAGNOSIS — M542 Cervicalgia: Secondary | ICD-10-CM

## 2020-02-07 DIAGNOSIS — M5410 Radiculopathy, site unspecified: Secondary | ICD-10-CM

## 2020-02-07 IMAGING — MR MR CERVICAL SPINE W/O CM
5 series · 28 of 48 positions shown · non-contrast
Comparison: None.

CLINICAL DATA: Neck pain extending to the shoulder blades

EXAM:
MRI CERVICAL SPINE WITHOUT CONTRAST
TECHNIQUE: Multiplanar, multisequence MR imaging of the cervical spine was
performed. No intravenous contrast was administered.

[Series 5: T1 · sagittal · 3.0mm · 0.66mm/px · 6 of 13 slices shown]
[im 1/13]
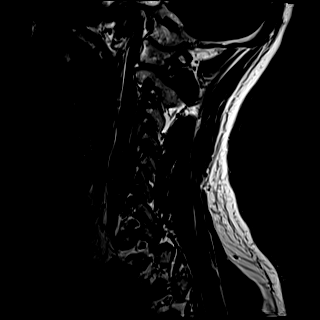
[im 3/13]
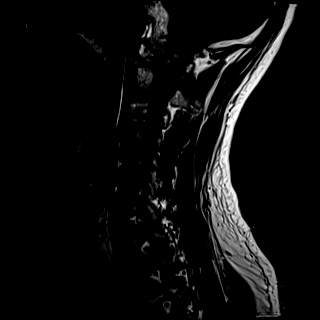
[im 5/13]
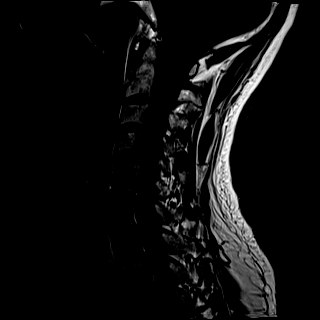
[im 8/13]
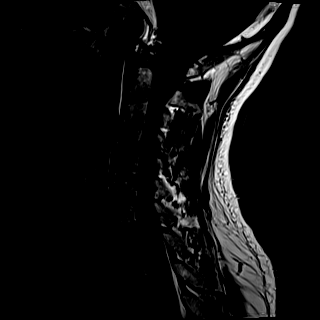
[im 10/13]
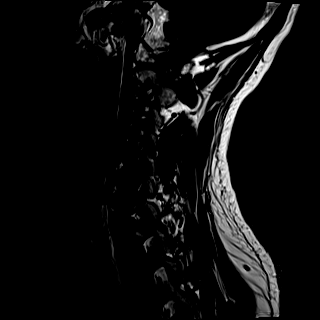
[im 13/13]
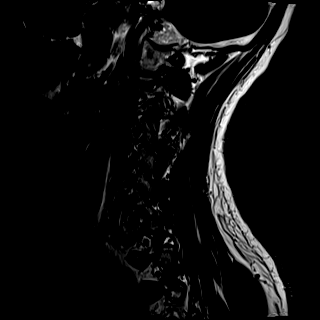

[Series 6: T2 · sagittal · 3.0mm · 0.55mm/px · 6 of 13 slices shown (1 of 2)]
[im 1/13]
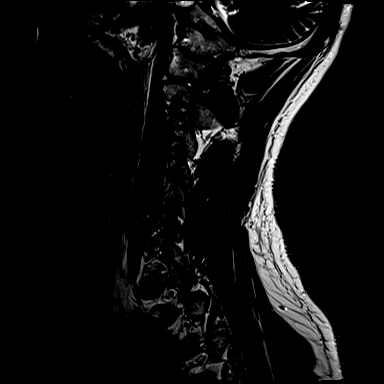
[im 3/13]
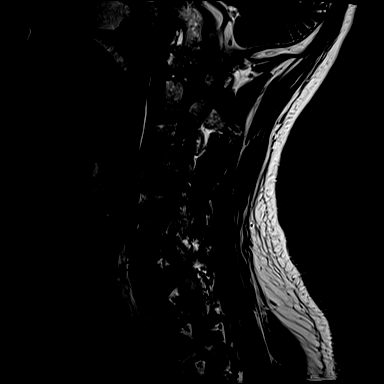
[im 5/13]
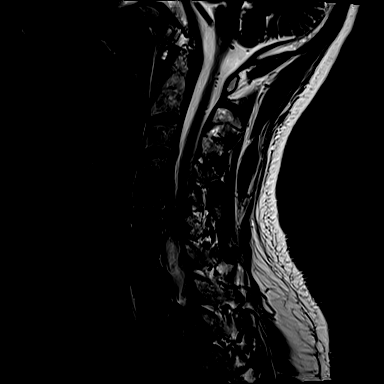
[im 8/13]
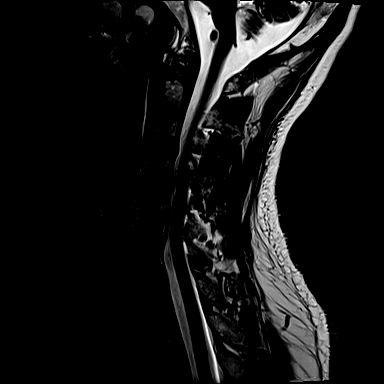
[im 10/13]
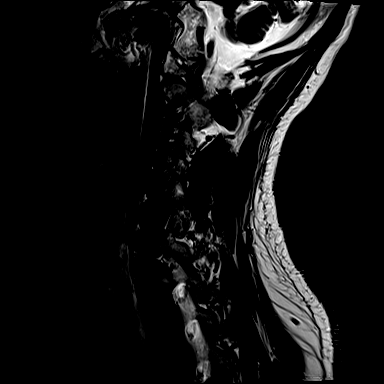
[im 13/13]
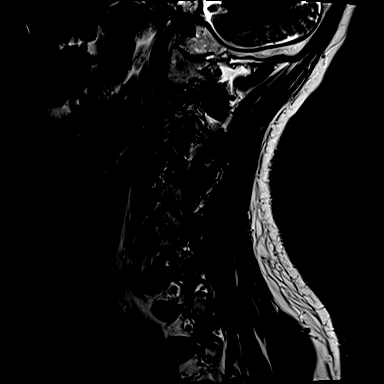

[Series 7: STIR · sagittal · 3.0mm · 0.33mm/px · 6 of 13 slices shown]
[im 1/13]
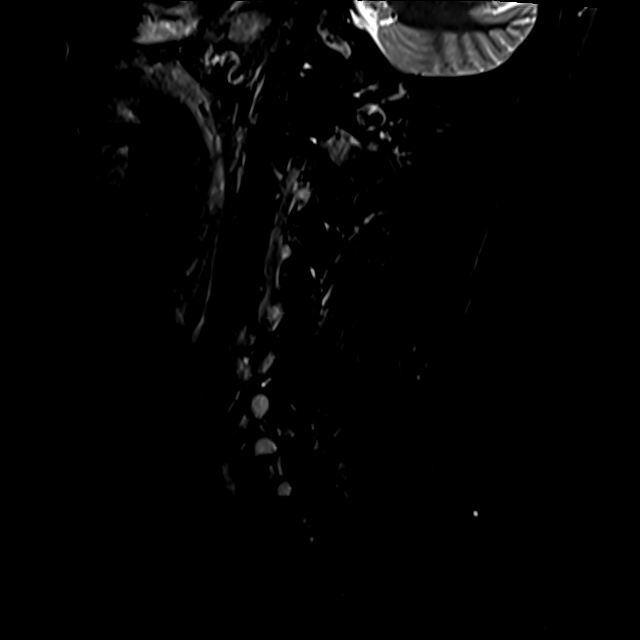
[im 3/13]
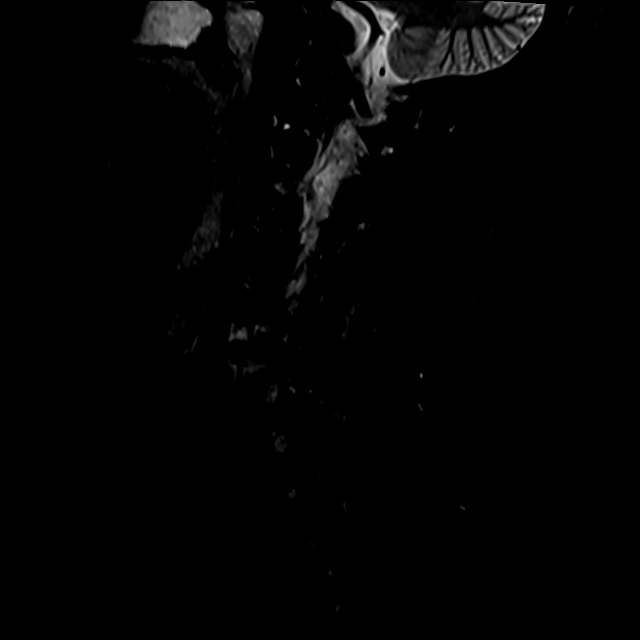
[im 5/13]
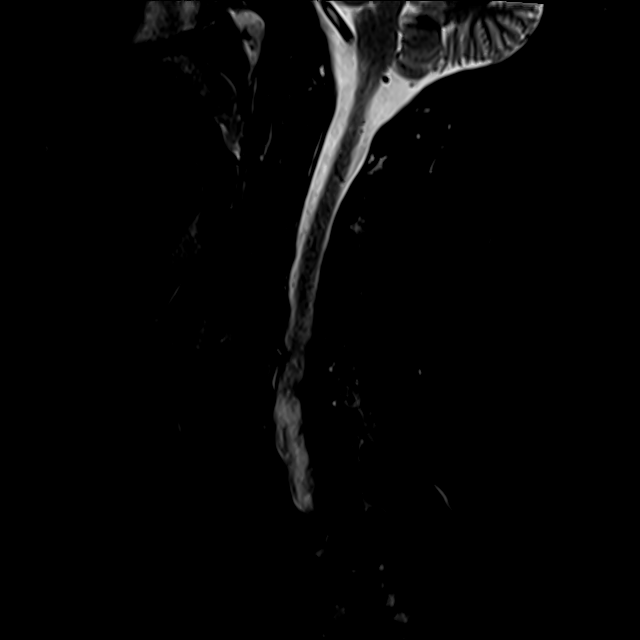
[im 8/13]
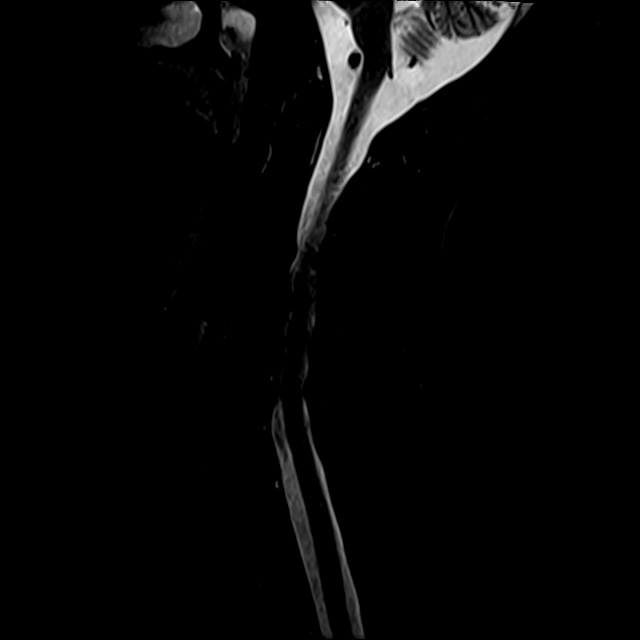
[im 10/13]
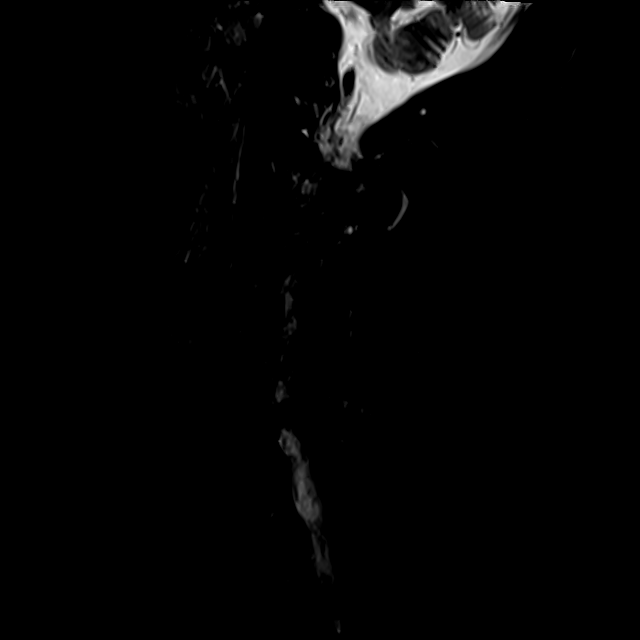
[im 13/13]
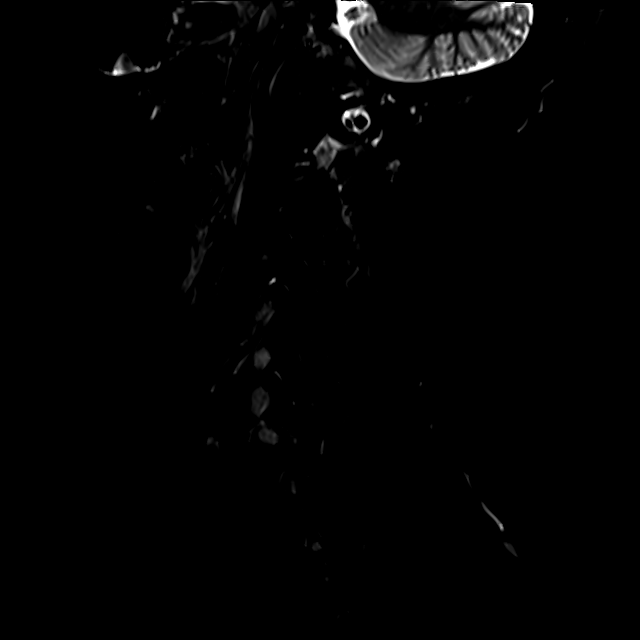

[Series 8: T2 · axial · 3.0mm · 0.50mm/px · z∈[-36,+71]mm · 9 of 30 slices shown (2 of 2)]
[im 1/30]
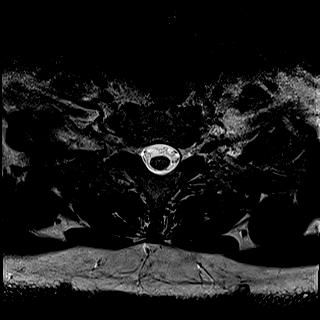
[im 5/30]
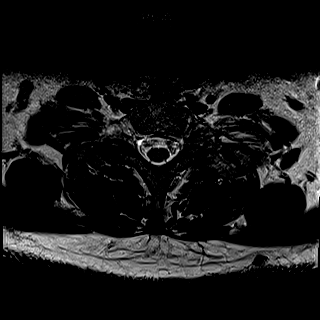
[im 9/30]
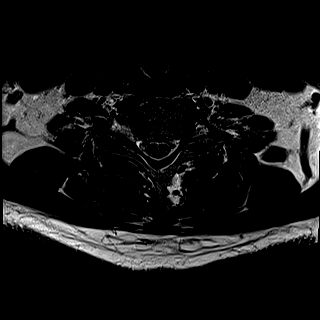
[im 13/30]
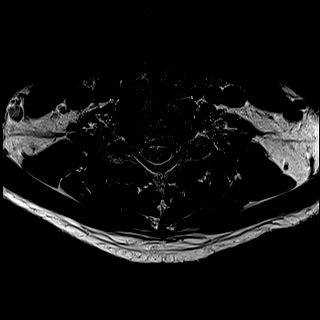
[im 15/30]
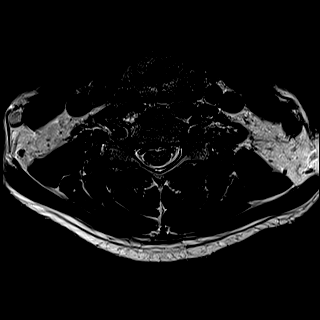
[im 17/30]
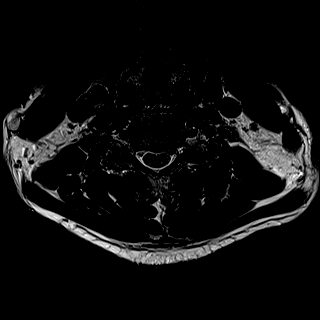
[im 21/30]
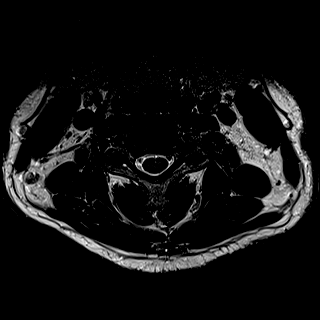
[im 25/30]
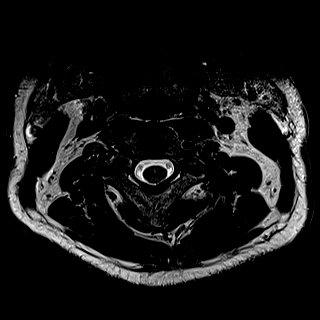
[im 30/30]
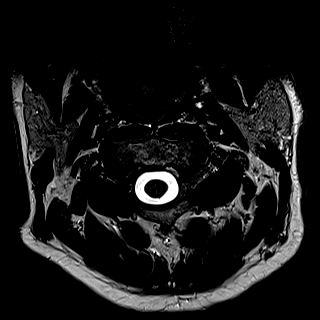

[Series 9: GRE · axial · 3.0mm · 0.42mm/px · 1 of 30 slices shown]
[im 1/30]
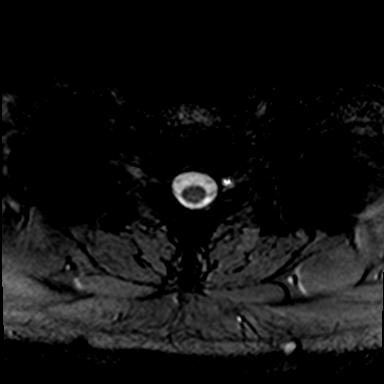

[28 of 48 positions shown; findings below may reference images not displayed]

FINDINGS: Alignment: Physiologic.

Vertebrae: No fracture, evidence of discitis, or bone lesion. Mild
discogenic edema on the right at C5-6.

Cord: Normal signal and morphology.

Posterior Fossa, vertebral arteries, paraspinal tissues: Tarlov
cyst.

Disc levels:

C2-3: Unremarkable.

C3-4: Disc narrowing and bilateral uncovertebral ridging with mild
foraminal stenosis. Patent canal

C4-5: Disc narrowing with shallow central protrusion which contacts
but does not compress the cord. The foramina are patent

C5-6: Disc narrowing with endplate and uncovertebral ridging causing
prominent bilateral foraminal impingement. Mild posterior disc
bulging with patent canal

C6-7: Disc narrowing and bulging with a left foraminal protrusion
impinging on the C7 nerve root, see sagittal T2 weighted imaging.
The canal and right foramen are patent

C7-T1:Degenerative facet spurring asymmetric to the left with left
C8 impingement based on sagittal images.
IMPRESSION: 1. Multilevel degenerative change as described.
2. High-grade foraminal impingement on both sides at C5-6 and on the
left at C6-7. Moderate left foraminal narrowing at C7-T1.
3. Up to mild spinal stenosis greatest at C4-5 where there is a
central disc protrusion.

## 2020-02-14 ENCOUNTER — Encounter: Payer: Self-pay | Admitting: *Deleted

## 2020-02-21 ENCOUNTER — Ambulatory Visit: Payer: Medicare PPO | Admitting: Internal Medicine

## 2020-02-21 ENCOUNTER — Encounter: Payer: Self-pay | Admitting: Internal Medicine

## 2020-02-21 VITALS — BP 110/70 | HR 68 | Temp 97.0°F | Ht 74.0 in | Wt 188.0 lb

## 2020-02-21 DIAGNOSIS — K648 Other hemorrhoids: Secondary | ICD-10-CM | POA: Diagnosis not present

## 2020-02-21 DIAGNOSIS — Z86010 Personal history of colonic polyps: Secondary | ICD-10-CM | POA: Diagnosis not present

## 2020-02-21 NOTE — Progress Notes (Signed)
Patient ID: Daniel [name], male   DOB: Oct 30, 1961, 59 y.o.   MRN: [id number] HPI: Daniel [name] is a 59 year old male with a history of adenomatous colon polyp, ankylosing spondylitis, who is seen to discuss hemorrhoidal symptoms and colon polyp surveillance.  He is here alone today.  He reports that he has been doing well but continues to deal with lower back pain and also neck pain.  He has a history of ankylosing spondylitis and he also has several family members with the same including his sister.  He has had prior hip replacement for avascular necrosis and is facing possible cervical disc/vertebral surgery but will be trying injections first.  From a GI perspective he reports that he will occasionally have red blood per rectum with bowel movement.  This is worse if he has a hard stool or feels constipated.  He will feel a bulge from the rectum which will bleed a little bit but not hurt.  Bowel movements are mostly regular and constipation is rarely an issue.  It is worse if he does not drink enough water or if he eats sweets including chocolate.  No abdominal pain.  No upper GI or hepatobiliary complaint.  Past Medical History:  Diagnosis Date  . Abrasion of hand, right 08/26/2014  . Anxiety   . Anxiety   . Arthritis   . Avascular necrosis of bone of hip, left ([place]) 04/26/2017  . Coronary atherosclerosis   . Cubital tunnel syndrome on right 08/2014  . Dental bridge present    lower left  . Dental crown present    lower right  . Depression   . GERD (gastroesophageal reflux disease)   . Head trauma   . Hiatal hernia   . Hyperlipidemia   . IBS (irritable bowel syndrome)   . Insomnia   . PONV (postoperative nausea and vomiting)   . Pulmonary nodule   . Tubular adenoma of colon   . 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 ARTERY REPAIR Right 08/30/2014   Procedure: RIGHT ULNAR ARTERY RESECTION WITH VEIN GRAFT;  Surgeon:  Charlotte Crumb, MD;  Location: [facility];  Service: Orthopedics;  Laterality: Right;  . ELBOW ARTHROSCOPY WITH TENDON RECONSTRUCTION Left 07/01/2011   ECRL and brevis  . HERNIA REPAIR  1998  . TONSILLECTOMY    . TOTAL HIP ARTHROPLASTY Left 04/26/2017  . TOTAL HIP ARTHROPLASTY Left 04/26/2017   Procedure: LEFT TOTAL HIP ARTHROPLASTY;  Surgeon: Marchia Bond, MD;  Location: [facility];  Service: Orthopedics;  Laterality: Left;  . ULNAR TUNNEL RELEASE Right 08/30/2014   Procedure: RIGHT CUBITAL TUNNEL RELEASE;  Surgeon: Charlotte Crumb, MD;  Location: [facility];  Service: Orthopedics;  Laterality: Right;  . UMBILICAL HERNIA REPAIR    . WRIST ARTHROSCOPY WITH ULNA SHORTENING Right 12/28/2007    Outpatient Medications Prior to Visit  Medication Sig Dispense Refill  . aspirin EC 81 MG tablet Take 81 mg by mouth daily.    Marland Kitchen atorvastatin (LIPITOR) 10 MG tablet Take 10 mg by mouth daily.    . Cimetidine (ACID REDUCER 200 PO) Take 1 tablet by mouth as needed.    . citalopram (CELEXA) 40 MG tablet Take 40 mg by mouth at bedtime.     Marland Kitchen atorvastatin (LIPITOR) 20 MG tablet Take 20 mg by mouth daily.    . baclofen (LIORESAL) 10 MG tablet Take 1 tablet (10 mg total) by mouth 3 (three) times  daily. As needed for muscle spasm 50 tablet 0  . lansoprazole (PREVACID) 15 MG capsule Take 15 mg by mouth daily at 12 noon.    . ondansetron (ZOFRAN) 4 MG tablet Take 1 tablet (4 mg total) by mouth every 8 (eight) hours as needed for nausea or vomiting. 30 tablet 0  . oxyCODONE (ROXICODONE) 5 MG immediate release tablet Take 1-2 tablets (5-10 mg total) by mouth every 4 (four) hours as needed for severe pain. 50 tablet 0  . rivaroxaban (XARELTO) 10 MG TABS tablet Take 1 tablet (10 mg total) by mouth daily. 21 tablet 0  . sennosides-docusate sodium (SENOKOT-S) 8.6-50 MG tablet Take 2 tablets by mouth daily. 30 tablet 1   No facility-administered medications prior to visit.    Allergies   Allergen Reactions  . Vicodin [Hydrocodone-Acetaminophen] Itching    Family History  Problem Relation Age of Onset  . Osteoarthritis Mother   . Hypertension Mother   . Fibromyalgia Mother   . Kidney disease Mother   . Anxiety disorder Mother   . CAD Father   . Heart attack Father 53  . Heart failure Father   . Hypertension Father   . Ovarian cancer Sister   . Colon cancer Neg Hx     Social History   Tobacco Use  . Smoking status: Former Research scientist (life sciences)  . Smokeless tobacco: Never Used  Substance Use Topics  . Alcohol use: Yes    Comment: occasionally  . Drug use: Yes    Types: Marijuana    ROS: As per history of present illness, otherwise negative  BP 110/70   Pulse 68   Temp (!) 97 F (36.1 C)   Ht '6\' 2"'  (1.88 m)   Wt 188 lb (85.3 kg)   BMI 24.14 kg/m  Gen: awake, alert, NAD HEENT: anicteric CV: RRR, no mrg Pulm: CTA b/l Abd: soft, NT/ND, +BS throughout Ext: no c/c/e Neuro: nonfocal  Lab work from primary care dated 01/07/2020; CMP within normal limits.  AST 18, ALT 19, alk phos 52, total bili 0.6 Hemoglobin 16.1, platelet count 244 white count 6.22  Colonoscopy dated 12/25/2014 2 sessile polyps removed from the sigmoid with cold snare and a rectal polyp removed with cold forcep.  Largest polyp was 5 mm.  One polyp was adenomatous, 2 were hyperplastic.  Internal hemorrhoids were found on retroflexion.  ASSESSMENT/PLAN: 59 year old male with a history of adenomatous colon polyp, ankylosing spondylitis, who is seen to discuss hemorrhoidal symptoms and colon polyp surveillance.  1.  Internal hemorrhoid with prolapse and intermittent bleeding --symptoms today consistent with internal hemorrhoid.  We discussed this today and also discussed hemorrhoidal banding.  At present he does not feel the symptoms are significant or frequent enough to warrant intervention but after our discussion if this becomes more problematic he can return for hemorrhoidal banding which would  likely improve if not resolve his hemorrhoidal symptoms.  Agree that he should continue with liberal water intake to help prevent constipation  2.  History of small adenoma of the colon --1 subcentimeter adenoma removed in February 2016.  We discussed guideline change which support surveillance at 7 years in patients with 1 or 2 small low risk adenomas.  In light of the guideline change I think it is reasonable to delay colonoscopy for surveillance until February 2023.  He is agreeable with this plan.  Return as needed    [id number], Jenny Reichmann, [place],  [place] [id number]

## 2020-02-21 NOTE — Patient Instructions (Signed)
You will be due for a recall colonoscopy in 12/2021. We will send you a reminder in the mail when it gets closer to that time. __________________________________________________ If you are age 59 or older, your body mass index should be between 23-30. Your Body mass index is 24.14 kg/m. If this is out of the aforementioned range listed, please consider follow up with your Primary Care Provider.  If you are age 60 or younger, your body mass index should be between 19-25. Your Body mass index is 24.14 kg/m. If this is out of the aformentioned range listed, please consider follow up with your Primary Care Provider.  __________________________________________________ Due to recent changes in healthcare laws, you may see the results of your imaging and laboratory studies on MyChart before your provider has had a chance to review them.  We understand that in some cases there may be results that are confusing or concerning to you. Not all laboratory results come back in the same time frame and the provider may be waiting for multiple results in order to interpret others.  Please give [place] 48 hours in order for your provider to thoroughly review all the results before contacting the office for clarification of your results.

## 2020-03-20 DIAGNOSIS — H9319 Tinnitus, unspecified ear: Secondary | ICD-10-CM | POA: Diagnosis not present

## 2020-03-20 DIAGNOSIS — M47812 Spondylosis without myelopathy or radiculopathy, cervical region: Secondary | ICD-10-CM | POA: Diagnosis not present

## 2020-03-20 DIAGNOSIS — K219 Gastro-esophageal reflux disease without esophagitis: Secondary | ICD-10-CM | POA: Diagnosis not present

## 2020-03-20 DIAGNOSIS — M545 Low back pain: Secondary | ICD-10-CM | POA: Diagnosis not present

## 2020-03-20 DIAGNOSIS — E785 Hyperlipidemia, unspecified: Secondary | ICD-10-CM | POA: Diagnosis not present

## 2020-03-20 DIAGNOSIS — M159 Polyosteoarthritis, unspecified: Secondary | ICD-10-CM | POA: Diagnosis not present

## 2020-03-20 DIAGNOSIS — M459 Ankylosing spondylitis of unspecified sites in spine: Secondary | ICD-10-CM | POA: Diagnosis not present

## 2020-03-20 DIAGNOSIS — G629 Polyneuropathy, unspecified: Secondary | ICD-10-CM | POA: Diagnosis not present

## 2020-03-20 DIAGNOSIS — F419 Anxiety disorder, unspecified: Secondary | ICD-10-CM | POA: Diagnosis not present

## 2020-04-10 DIAGNOSIS — M5412 Radiculopathy, cervical region: Secondary | ICD-10-CM | POA: Diagnosis not present

## 2020-05-13 DIAGNOSIS — M542 Cervicalgia: Secondary | ICD-10-CM | POA: Diagnosis not present

## 2020-07-31 DIAGNOSIS — Z23 Encounter for immunization: Secondary | ICD-10-CM | POA: Diagnosis not present

## 2020-08-14 DIAGNOSIS — R030 Elevated blood-pressure reading, without diagnosis of hypertension: Secondary | ICD-10-CM | POA: Diagnosis not present

## 2020-08-14 DIAGNOSIS — M542 Cervicalgia: Secondary | ICD-10-CM | POA: Diagnosis not present

## 2020-08-19 ENCOUNTER — Other Ambulatory Visit: Payer: Self-pay | Admitting: Neurological Surgery

## 2020-11-10 NOTE — Progress Notes (Signed)
[facility] - [place] - [address] [place] [address] Phone: [phone] Fax: [phone]  [id number] [name], [place] - [address] [place] [address] Phone: [phone] Fax: [phone]      Your procedure is scheduled on 11/14/20.  Report to [facility] Main Entrance "A" at 05:30 A.M., and check in at the Admitting office.  Call this number if you have problems the morning of surgery:  [phone]  Call [phone] if you have any questions prior to your surgery date Monday-Friday 8am-4pm    Remember:  Do not eat or drink after midnight the night before your surgery    Take these medicines the morning of surgery with A SIP OF WATER  atorvastatin (LIPITOR) lansoprazole (PREVACID)  As of today, STOP taking any Aspirin (unless otherwise instructed by your surgeon) Aleve, Naproxen, Ibuprofen, Motrin, Advil, Goody's, BC's, all herbal medications, fish oil, and all vitamins.                      Do not wear jewelry, make up, or nail polish            Do not wear lotions, powders, perfumes/colognes, or deodorant.            Men may shave face and neck.            Do not bring valuables to the hospital.            [facility] is not responsible for any belongings or valuables.  Do NOT Smoke (Tobacco/Vaping) or drink Alcohol 24 hours prior to your procedure If you use a CPAP at night, you may bring all equipment for your overnight stay.   Contacts, glasses, dentures or bridgework may not be worn into surgery.      For patients admitted to the hospital, discharge time will be determined by your treatment team.   Patients discharged the day of surgery will not be allowed to drive home, and someone needs to stay with them for 24 hours.    Special instructions:   [place] [facility]- Preparing For Surgery  Before  surgery, you can play an important role. Because skin is not sterile, your skin needs to be as free of germs as possible. You can reduce the number of germs on your skin by washing with CHG (chlorahexidine gluconate) Soap before surgery.  CHG is an antiseptic cleaner which kills germs and bonds with the skin to continue killing germs even after washing.    Oral Hygiene is also important to reduce your risk of infection.  Remember - BRUSH YOUR TEETH THE MORNING OF SURGERY WITH YOUR REGULAR TOOTHPASTE  Please do not use if you have an allergy to CHG or antibacterial soaps. If your skin becomes reddened/irritated stop using the CHG.  Do not shave (including legs and underarms) for at least 48 hours prior to first CHG shower. It is OK to shave your face.  Please follow these instructions carefully.   1. Shower the NIGHT BEFORE SURGERY and the MORNING OF SURGERY with CHG Soap.   2. If you chose to wash your hair, wash your hair first as usual with your normal shampoo.  3. After you shampoo, rinse your hair and body thoroughly to remove the shampoo.  4. Use CHG as you would any other liquid soap. You can apply CHG directly to  the skin and wash gently with a scrungie or a clean washcloth.   5. Apply the CHG Soap to your body ONLY FROM THE NECK DOWN.  Do not use on open wounds or open sores. Avoid contact with your eyes, ears, mouth and genitals (private parts). Wash Face and genitals (private parts)  with your normal soap.   6. Wash thoroughly, paying special attention to the area where your surgery will be performed.  7. Thoroughly rinse your body with warm water from the neck down.  8. DO NOT shower/wash with your normal soap after using and rinsing off the CHG Soap.  9. Pat yourself dry with a CLEAN TOWEL.  10. Wear CLEAN PAJAMAS to bed the night before surgery  11. Place CLEAN SHEETS on your bed the night of your first shower and DO NOT SLEEP WITH PETS.   Day of Surgery: Wear  Clean/Comfortable clothing the morning of surgery Do not apply any deodorants/lotions.   Remember to brush your teeth WITH YOUR REGULAR TOOTHPASTE.   Please read over the following fact sheets that you were given.

## 2020-11-11 ENCOUNTER — Encounter (HOSPITAL_COMMUNITY)
Admission: RE | Admit: 2020-11-11 | Discharge: 2020-11-11 | Disposition: A | Discharge status: Home / Self Care | Payer: Medicare PPO | Source: Ambulatory Visit | Attending: Neurological Surgery | Admitting: Neurological Surgery

## 2020-11-11 ENCOUNTER — Other Ambulatory Visit (HOSPITAL_COMMUNITY): Payer: Medicare PPO

## 2020-11-11 ENCOUNTER — Other Ambulatory Visit: Payer: Self-pay

## 2020-11-14 ENCOUNTER — Ambulatory Visit: Admit: 2020-11-14 | Payer: Medicare PPO | Admitting: Neurological Surgery

## 2020-11-14 DIAGNOSIS — M47812 Spondylosis without myelopathy or radiculopathy, cervical region: Secondary | ICD-10-CM | POA: Diagnosis not present

## 2020-11-14 DIAGNOSIS — M5412 Radiculopathy, cervical region: Secondary | ICD-10-CM | POA: Diagnosis not present

## 2020-11-14 DIAGNOSIS — M542 Cervicalgia: Secondary | ICD-10-CM | POA: Diagnosis not present

## 2020-11-14 DIAGNOSIS — M4802 Spinal stenosis, cervical region: Secondary | ICD-10-CM | POA: Diagnosis not present

## 2020-11-14 HISTORY — PX: CERVICAL SPINE SURGERY: SHX589

## 2020-11-14 SURGERY — ANTERIOR CERVICAL DECOMPRESSION/DISCECTOMY FUSION 2 LEVELS
Anesthesia: General

## 2020-12-30 DIAGNOSIS — M542 Cervicalgia: Secondary | ICD-10-CM | POA: Diagnosis not present

## 2020-12-30 DIAGNOSIS — I10 Essential (primary) hypertension: Secondary | ICD-10-CM | POA: Diagnosis not present

## 2021-01-02 DIAGNOSIS — Z125 Encounter for screening for malignant neoplasm of prostate: Secondary | ICD-10-CM | POA: Diagnosis not present

## 2021-01-02 DIAGNOSIS — E785 Hyperlipidemia, unspecified: Secondary | ICD-10-CM | POA: Diagnosis not present

## 2021-01-09 DIAGNOSIS — Z0000 Encounter for general adult medical examination without abnormal findings: Secondary | ICD-10-CM | POA: Diagnosis not present

## 2021-01-09 DIAGNOSIS — E785 Hyperlipidemia, unspecified: Secondary | ICD-10-CM | POA: Diagnosis not present

## 2021-01-09 DIAGNOSIS — R82998 Other abnormal findings in urine: Secondary | ICD-10-CM | POA: Diagnosis not present

## 2021-01-09 DIAGNOSIS — Z1212 Encounter for screening for malignant neoplasm of rectum: Secondary | ICD-10-CM | POA: Diagnosis not present

## 2021-01-09 DIAGNOSIS — R030 Elevated blood-pressure reading, without diagnosis of hypertension: Secondary | ICD-10-CM | POA: Diagnosis not present

## 2021-01-09 DIAGNOSIS — H04129 Dry eye syndrome of unspecified lacrimal gland: Secondary | ICD-10-CM | POA: Diagnosis not present

## 2021-01-09 DIAGNOSIS — E781 Pure hyperglyceridemia: Secondary | ICD-10-CM | POA: Diagnosis not present

## 2021-01-09 DIAGNOSIS — G4700 Insomnia, unspecified: Secondary | ICD-10-CM | POA: Diagnosis not present

## 2021-01-09 DIAGNOSIS — K219 Gastro-esophageal reflux disease without esophagitis: Secondary | ICD-10-CM | POA: Diagnosis not present

## 2021-01-09 DIAGNOSIS — I2584 Coronary atherosclerosis due to calcified coronary lesion: Secondary | ICD-10-CM | POA: Diagnosis not present

## 2021-01-09 DIAGNOSIS — F418 Other specified anxiety disorders: Secondary | ICD-10-CM | POA: Diagnosis not present

## 2021-04-02 DIAGNOSIS — M542 Cervicalgia: Secondary | ICD-10-CM | POA: Diagnosis not present

## 2021-04-13 DIAGNOSIS — L0291 Cutaneous abscess, unspecified: Secondary | ICD-10-CM | POA: Diagnosis not present

## 2021-04-14 DIAGNOSIS — L089 Local infection of the skin and subcutaneous tissue, unspecified: Secondary | ICD-10-CM | POA: Diagnosis not present

## 2021-04-14 DIAGNOSIS — L723 Sebaceous cyst: Secondary | ICD-10-CM | POA: Diagnosis not present

## 2021-04-28 DIAGNOSIS — M542 Cervicalgia: Secondary | ICD-10-CM | POA: Diagnosis not present

## 2021-04-28 DIAGNOSIS — M5012 Mid-cervical disc disorder, unspecified level: Secondary | ICD-10-CM | POA: Diagnosis not present

## 2021-04-28 DIAGNOSIS — M5412 Radiculopathy, cervical region: Secondary | ICD-10-CM | POA: Diagnosis not present

## 2021-04-28 DIAGNOSIS — M4722 Other spondylosis with radiculopathy, cervical region: Secondary | ICD-10-CM | POA: Diagnosis not present

## 2021-06-04 DIAGNOSIS — M7989 Other specified soft tissue disorders: Secondary | ICD-10-CM | POA: Diagnosis not present

## 2021-06-10 ENCOUNTER — Other Ambulatory Visit: Payer: Self-pay | Admitting: Surgery

## 2021-06-10 DIAGNOSIS — M7989 Other specified soft tissue disorders: Secondary | ICD-10-CM

## 2021-06-26 ENCOUNTER — Ambulatory Visit
Admission: RE | Admit: 2021-06-26 | Discharge: 2021-06-26 | Disposition: A | Discharge status: Home / Self Care | Payer: Medicare PPO | Source: Ambulatory Visit | Attending: Surgery | Admitting: Surgery

## 2021-06-26 DIAGNOSIS — N289 Disorder of kidney and ureter, unspecified: Secondary | ICD-10-CM | POA: Diagnosis not present

## 2021-06-26 DIAGNOSIS — M7989 Other specified soft tissue disorders: Secondary | ICD-10-CM

## 2021-06-26 DIAGNOSIS — K760 Fatty (change of) liver, not elsewhere classified: Secondary | ICD-10-CM | POA: Diagnosis not present

## 2021-06-26 DIAGNOSIS — I2510 Atherosclerotic heart disease of native coronary artery without angina pectoris: Secondary | ICD-10-CM | POA: Diagnosis not present

## 2021-06-26 DIAGNOSIS — I700 Atherosclerosis of aorta: Secondary | ICD-10-CM | POA: Diagnosis not present

## 2021-06-26 IMAGING — CT CT CHEST W/ CM
2 of 4 series · 9 of 36 positions shown, 11 images · IV contrast (iopamidol)
Comparison: CT cardiac [DATE]

CLINICAL DATA: Had cyst removed 2 months ago, now Painful soft
tissue mass x2 months No hx of surgery No hx of cancer Former smoke

EXAM:
CT CHEST WITH CONTRAST
TECHNIQUE: Multidetector CT imaging of the chest was performed during
intravenous contrast administration.
CONTRAST:  75mL [J9] IOPAMIDOL ([J9]) INJECTION 61%

[Series 2: chest 2.00 br40 s3 · axial · 0.55mm/px · z∈[+1571,+1845]mm · 6 of 179 slices shown, 8 images (1 of 2)]
[im 28/179  mediastinal]
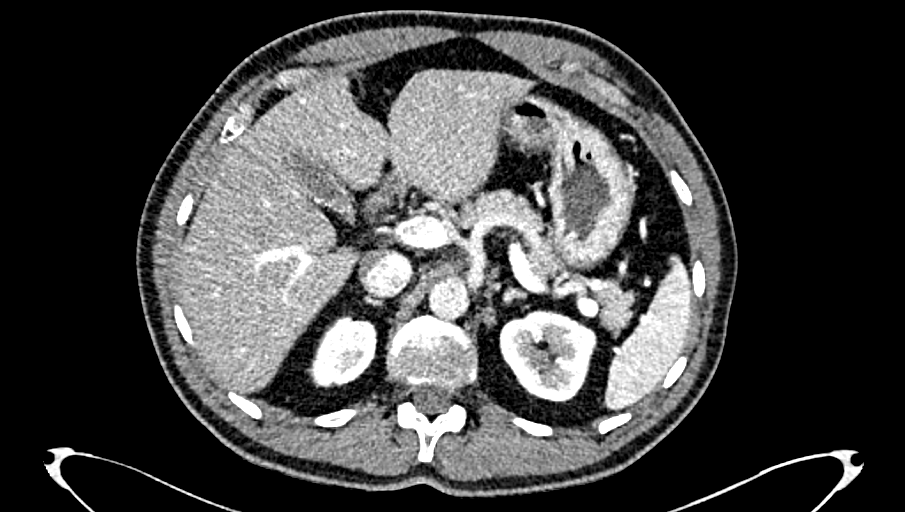
[im 28/179  lung]
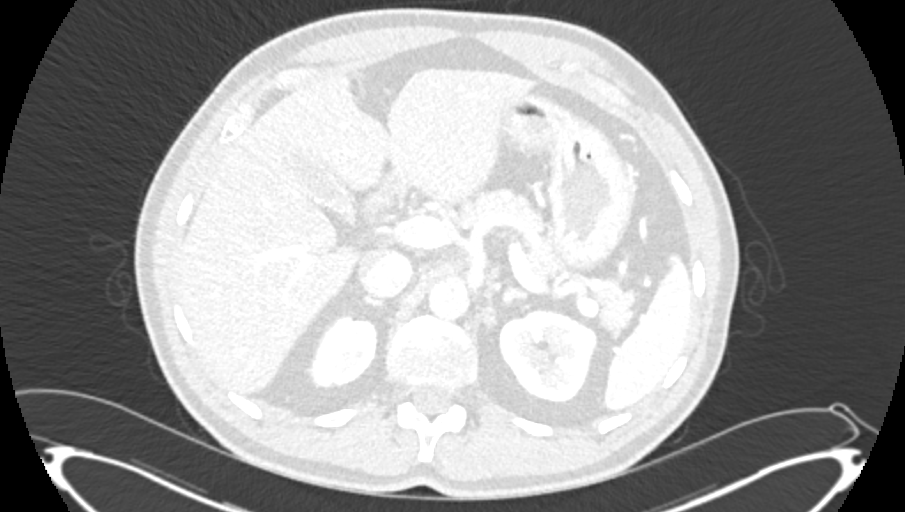
[im 55/179  lung]
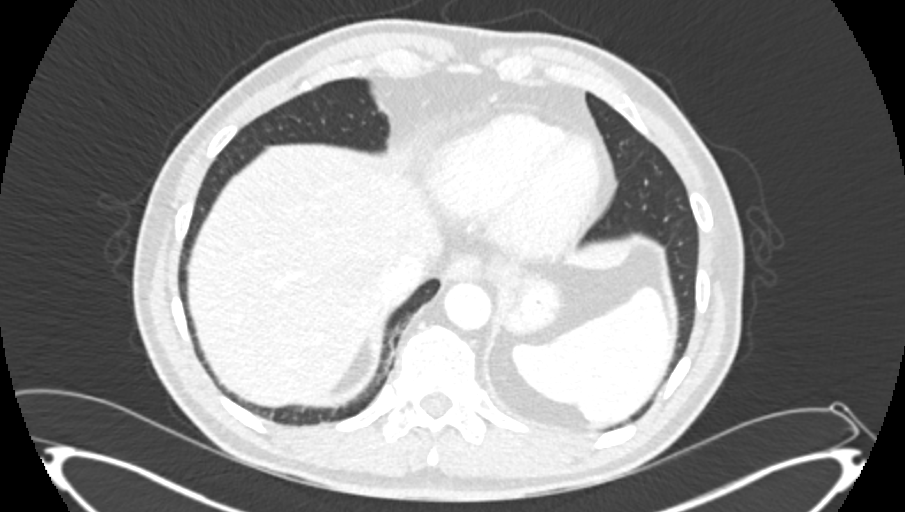
[im 83/179  lung]
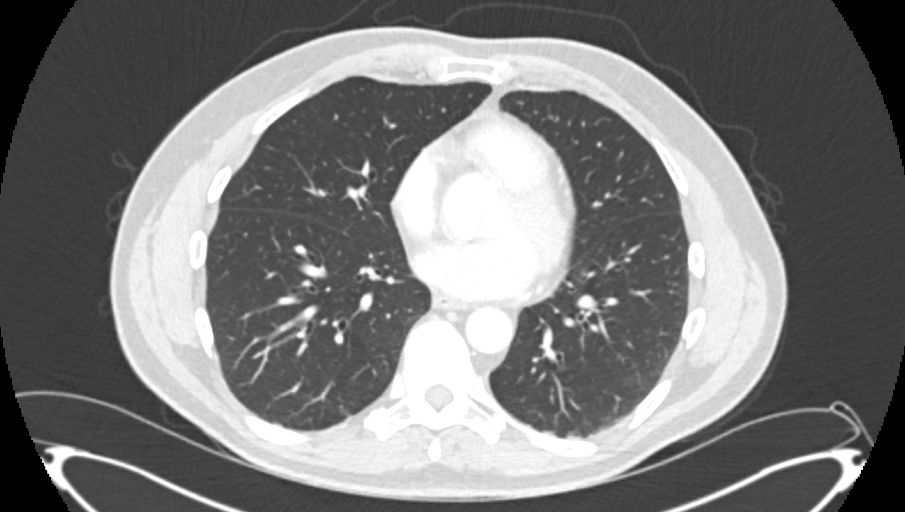
[im 110/179  lung]
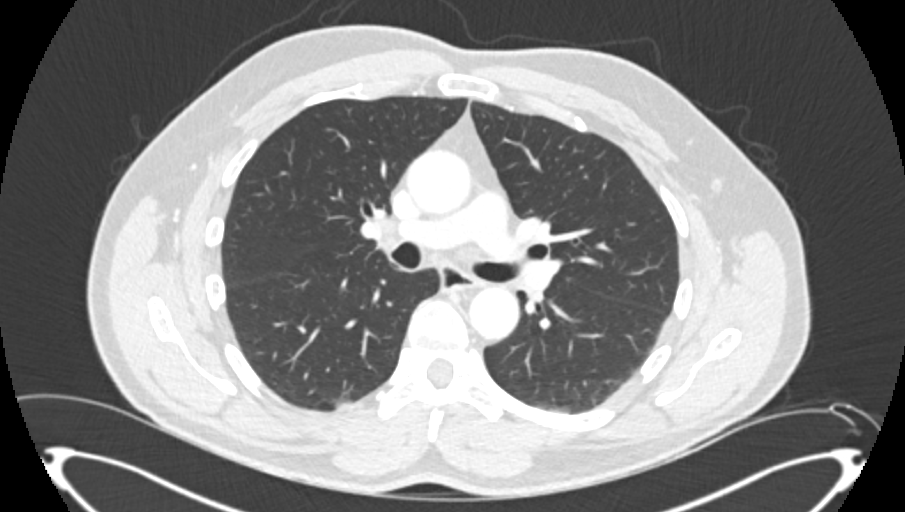
[im 137/179  mediastinal]
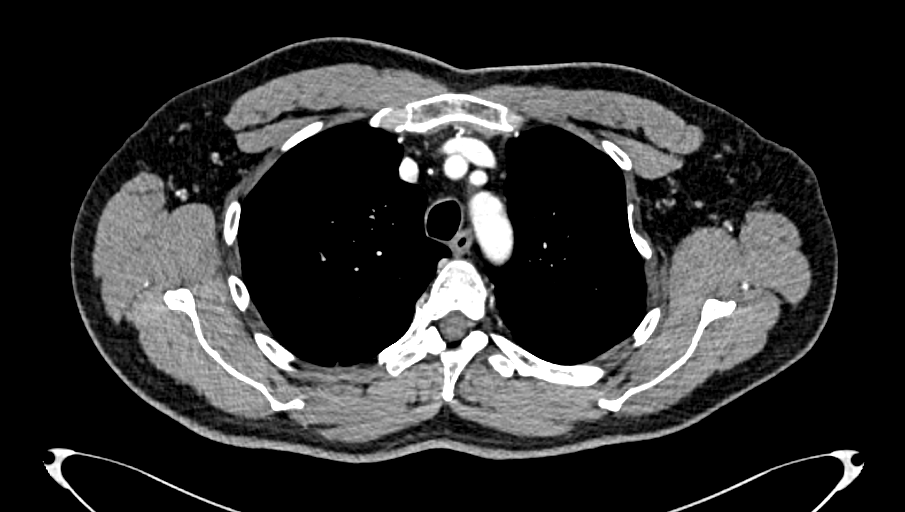
[im 137/179  lung]
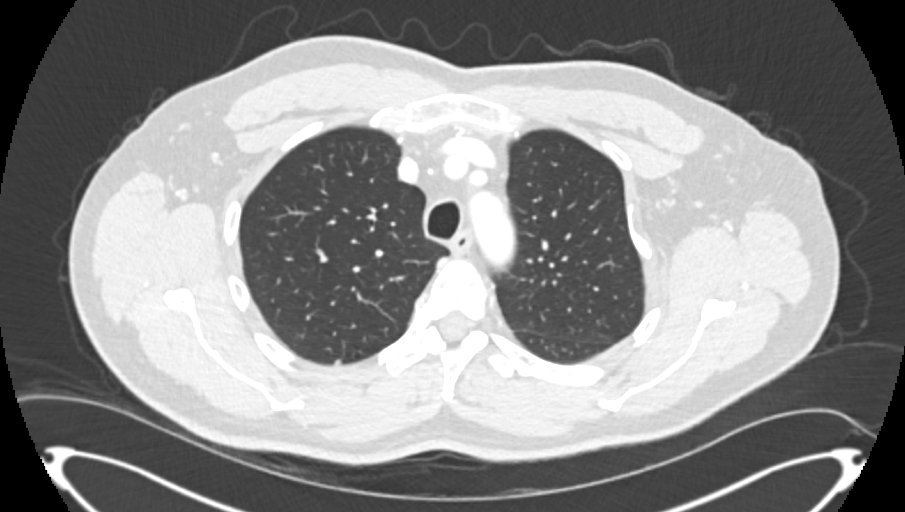
[im 165/179  lung]
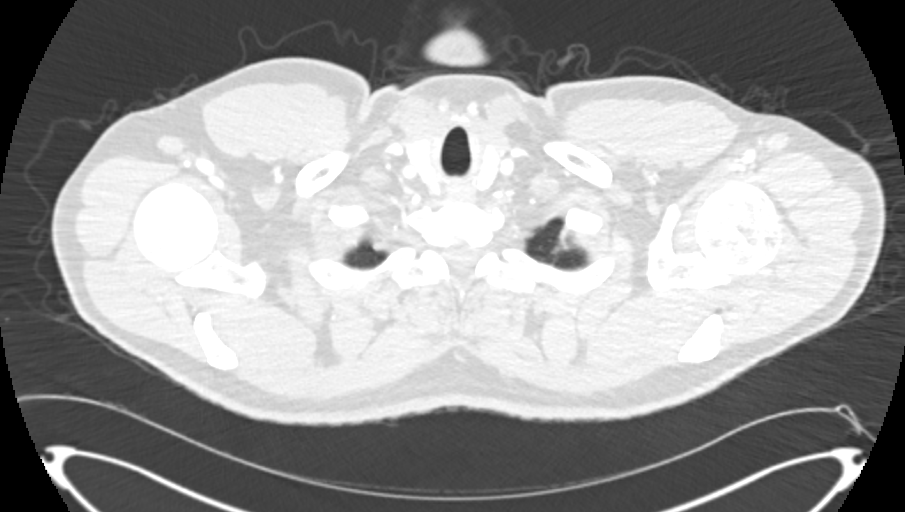

[Series 4: chest 2.00 br40 s3 · coronal · 0.70mm/px · 3 of 139 slices shown (2 of 2)]
[im 28/139  lung]
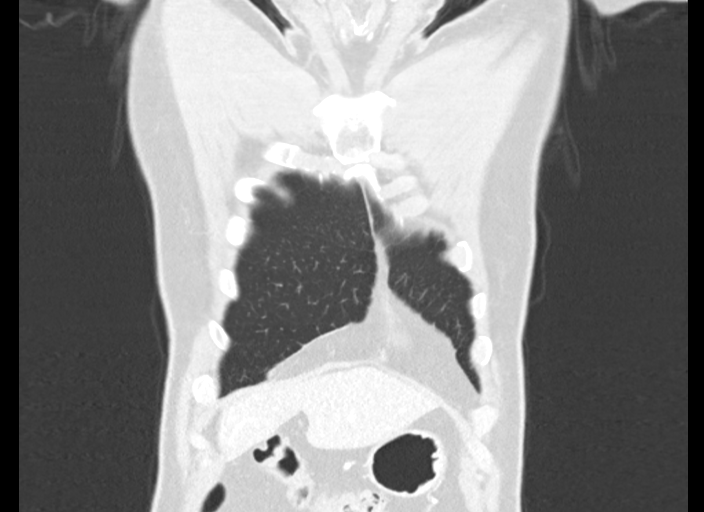
[im 56/139  lung]
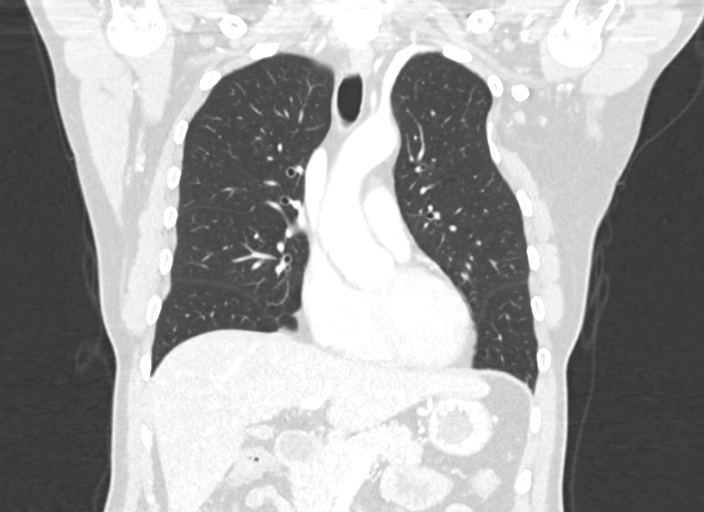
[im 83/139  lung]
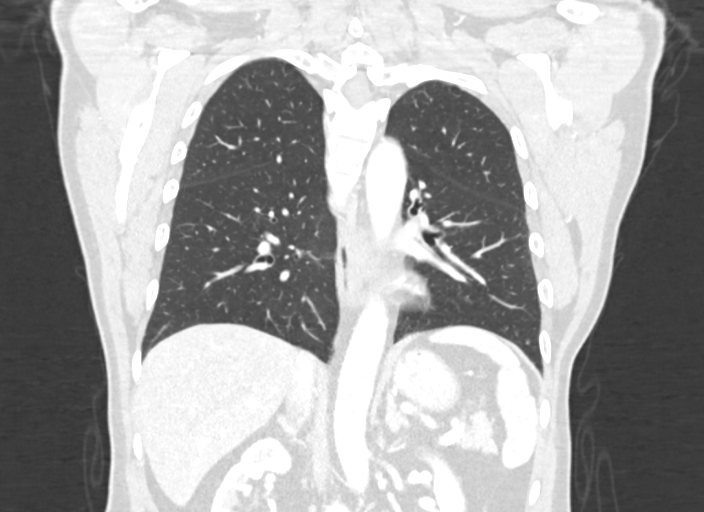

[9 of 36 positions shown; findings below may reference images not displayed]

FINDINGS: Cardiovascular: Normal heart size. No significant pericardial
effusion. The thoracic aorta is normal in caliber. At least left
anterior descending coronary artery calcifications. The main
pulmonary artery is normal in caliber. No central pulmonary embolus.

Mediastinum/Nodes: No enlarged mediastinal, hilar, or axillary lymph
nodes. Thyroid gland, trachea, and esophagus demonstrate no
significant findings.

Lungs/Pleura: No focal consolidation. A stable 4 mm right upper lobe
sub micronodule along the minor fissure ([DATE]). Another 4 mm right
upper lobe subpleural micronodule ([DATE]). A posterior right upper
lobe subpleural micronodule ([DATE]). No pulmonary mass. No pleural
effusion. No pneumothorax.

Upper Abdomen: Hepatic steatosis. Nonspecific 1.1 cm soft tissue
density just inferior to the left adrenal gland ([DATE]). There is a
1.3 cm left renal lesion with a density of 25 Hounsfield units.
Otherwise no acute abnormality.

Musculoskeletal:

No chest or back wall abnormality.

No suspicious lytic or blastic osseous lesions. No acute displaced
fracture. Multilevel degenerative changes of the spine.
IMPRESSION: 1. No acute intrathoracic abnormality.
2. No soft tissue abnormality.
3. Indeterminate 1.3 cm left renal lesion. Recommend nonemergent
follow-up with MRI renal protocol.
4. Left anterior descending coronary calcification.

## 2021-06-26 MED ORDER — IOPAMIDOL (ISOVUE-300) INJECTION 61%
75.0000 mL | Freq: Once | INTRAVENOUS | Status: AC | PRN
  Administered 2021-06-26: 75 mL via INTRAVENOUS

## 2021-07-09 DIAGNOSIS — L723 Sebaceous cyst: Secondary | ICD-10-CM | POA: Diagnosis not present

## 2021-07-09 DIAGNOSIS — N2889 Other specified disorders of kidney and ureter: Secondary | ICD-10-CM | POA: Diagnosis not present

## 2021-07-13 ENCOUNTER — Other Ambulatory Visit: Payer: Self-pay | Admitting: Surgery

## 2021-07-13 DIAGNOSIS — N2889 Other specified disorders of kidney and ureter: Secondary | ICD-10-CM

## 2021-07-30 ENCOUNTER — Other Ambulatory Visit: Payer: Self-pay

## 2021-07-30 ENCOUNTER — Ambulatory Visit
Admission: RE | Admit: 2021-07-30 | Discharge: 2021-07-30 | Disposition: A | Discharge status: Home / Self Care | Payer: Medicare PPO | Source: Ambulatory Visit | Attending: Surgery | Admitting: Surgery

## 2021-07-30 DIAGNOSIS — N2889 Other specified disorders of kidney and ureter: Secondary | ICD-10-CM

## 2021-07-30 DIAGNOSIS — N281 Cyst of kidney, acquired: Secondary | ICD-10-CM | POA: Diagnosis not present

## 2021-07-30 IMAGING — MR MR ABDOMEN WO/W CM
18 series · 48 of 48 positions shown · IV contrast (multihance)
Comparison: Chest CT on [DATE]

CLINICAL DATA: Indeterminate left renal lesion on recent chest CT.

EXAM:
MRI ABDOMEN WITHOUT AND WITH CONTRAST
TECHNIQUE: Multiplanar multisequence MR imaging of the abdomen was performed
both before and after the administration of intravenous contrast.
CONTRAST:  17mL MULTIHANCE GADOBENATE DIMEGLUMINE 529 MG/ML IV SOLN

[Series 3: T2 · coronal · 5.0mm · 0.74mm/px · 1 of 19 slices shown (1 of 3)]
[im 1/19]
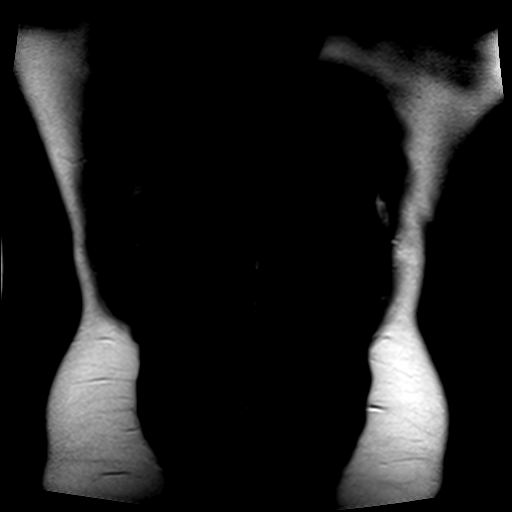

[Series 4: T2 · axial · 5.0mm · 0.72mm/px · z∈[-155,+67]mm · 2 of 38 slices shown (2 of 3)]
[im 1/38]
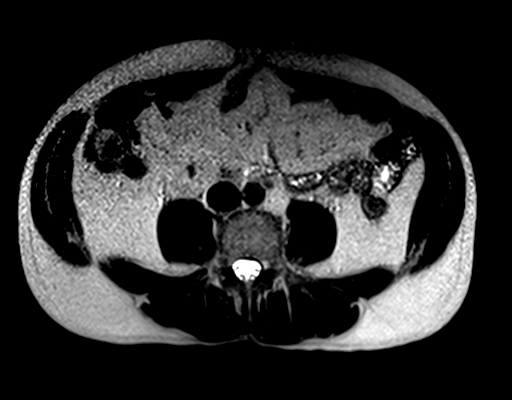
[im 38/38]
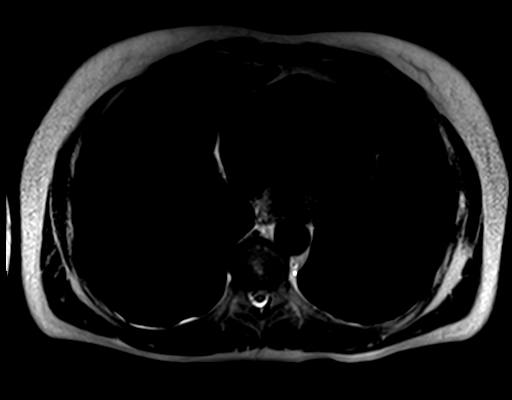

[Series 5: ep2d_diff_b50_500_800_p2_trig · axial · 5.0mm · 1.93mm/px · z∈[-125,+91]mm · 5 of 111 slices shown]
[im 1/111]
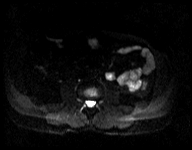
[im 28/111]
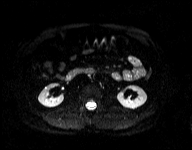
[im 56/111]
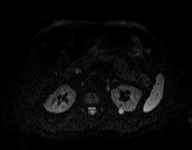
[im 83/111]
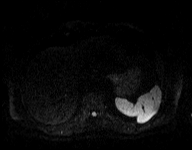
[im 111/111]
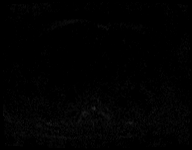

[Series 6: [id number]_800_p2_trig_adc · axial · 5.0mm · 1.93mm/px · z∈[-125,+91]mm · 2 of 37 slices shown]
[im 1/37]
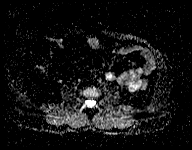
[im 37/37]
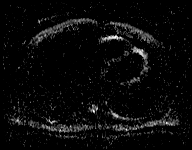

[Series 7: T2 · axial · 5.0mm · 1.45mm/px · z∈[-125,+91]mm · 2 of 37 slices shown (3 of 3)]
[im 1/37]
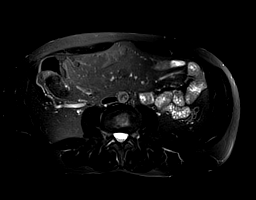
[im 37/37]
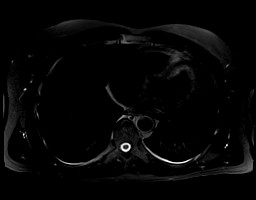

[Series 8: bSSFP · axial · 4.0mm · 0.72mm/px · z∈[-152,+44]mm · 2 of 50 slices shown (1 of 2)]
[im 1/50]
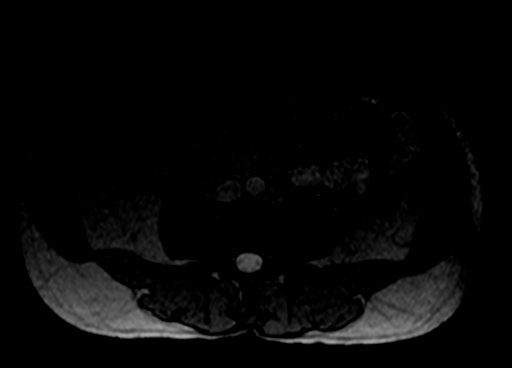
[im 50/50]
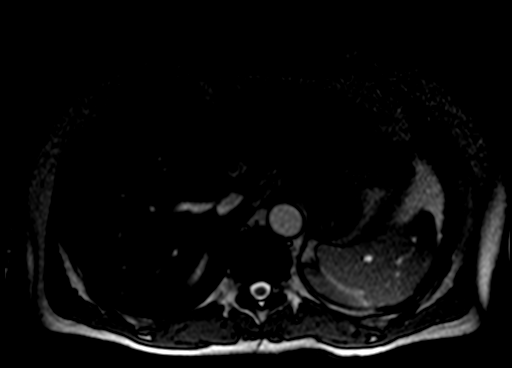

[Series 9: T1 · axial · 5.0mm · 0.72mm/px · z∈[-153,+45]mm · 3 of 68 slices shown]
[im 1/68]
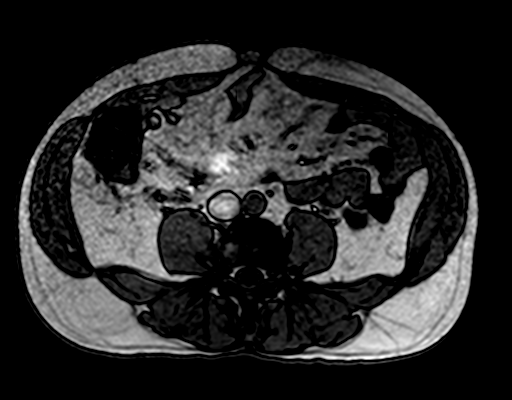
[im 34/68]
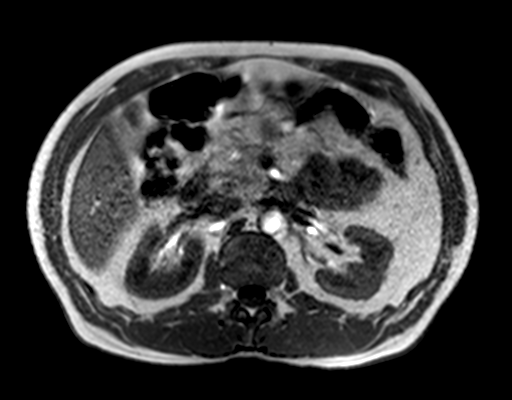
[im 68/68]
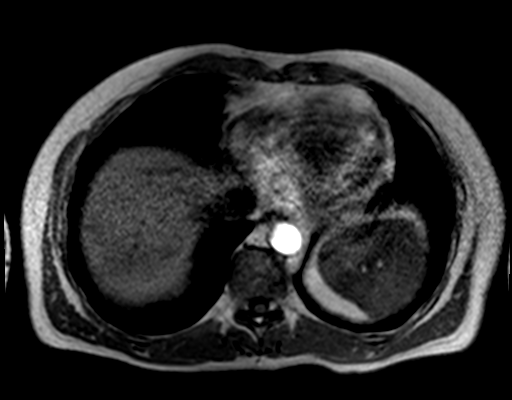

[Series 10: bSSFP · axial · 4.0mm · 0.72mm/px · z∈[-152,+44]mm · 2 of 50 slices shown (2 of 2)]
[im 1/50]
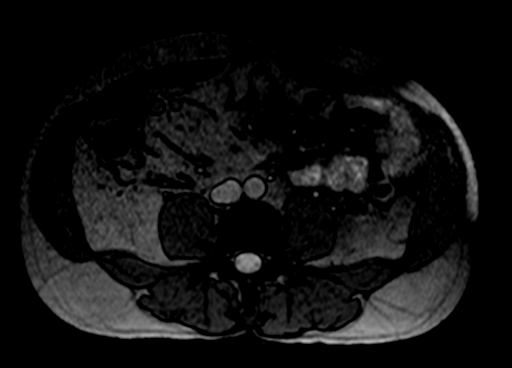
[im 50/50]
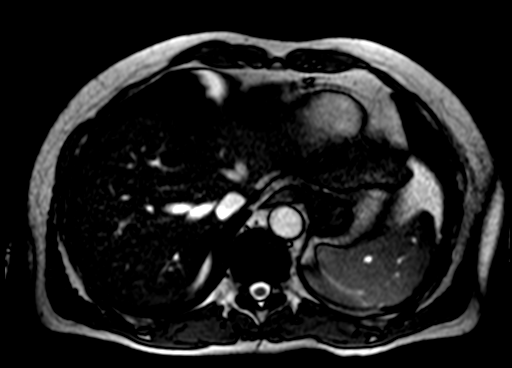

[Series 11: T1 dynamic · axial · non-contrast · 2.3mm · 1.45mm/px · z∈[-161,+21]mm · 3 of 80 slices shown]
[im 1/80]
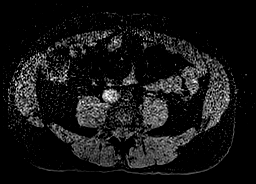
[im 40/80]
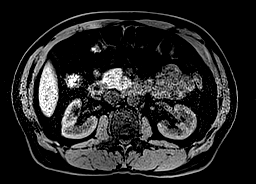
[im 80/80]
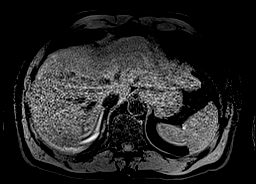

[Series 12: post 25 sec · axial · 2.3mm · 1.45mm/px · z∈[-161,+21]mm · 3 of 80 slices shown]
[im 1/80]
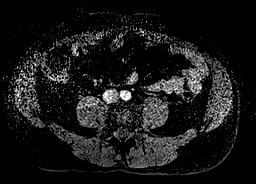
[im 40/80]
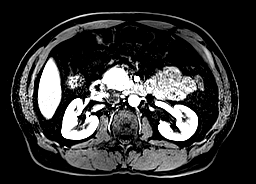
[im 80/80]
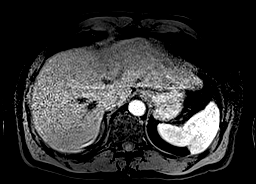

[Series 13: post 25 sec_sub · axial · 2.3mm · 1.45mm/px · z∈[-161,+21]mm · 3 of 80 slices shown]
[im 1/80]
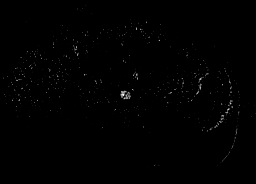
[im 40/80]
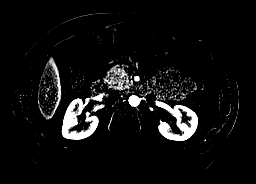
[im 80/80]
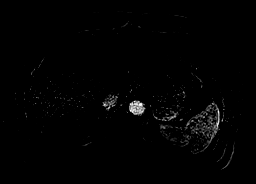

[Series 14: post 45 sec · axial · 2.3mm · 1.45mm/px · z∈[-161,+21]mm · 3 of 80 slices shown]
[im 1/80]
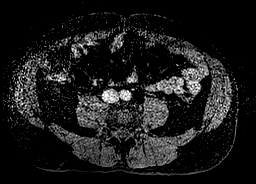
[im 40/80]
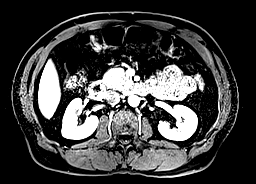
[im 80/80]
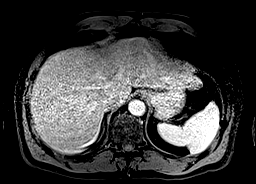

[Series 15: post 45 sec_sub · axial · 2.3mm · 1.45mm/px · z∈[-161,+21]mm · 3 of 80 slices shown]
[im 1/80]
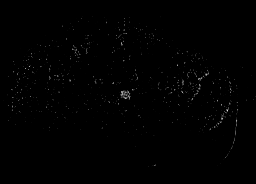
[im 40/80]
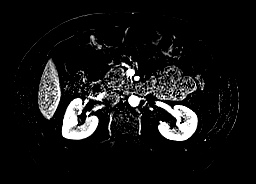
[im 80/80]
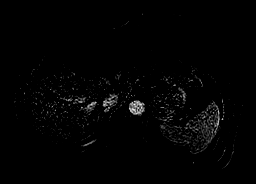

[Series 16: post 90 sec · axial · 2.3mm · 1.45mm/px · z∈[-161,+21]mm · 3 of 80 slices shown]
[im 1/80]
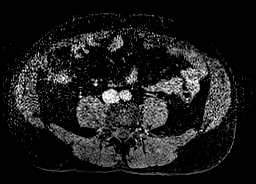
[im 40/80]
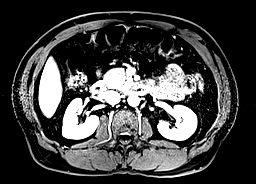
[im 80/80]
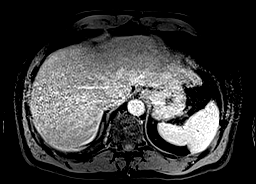

[Series 17: post 90 sec_sub · axial · 2.3mm · 1.45mm/px · z∈[-161,+21]mm · 3 of 80 slices shown]
[im 1/80]
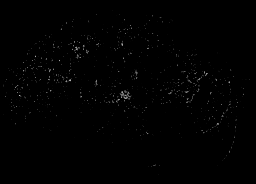
[im 40/80]
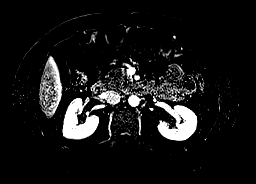
[im 80/80]
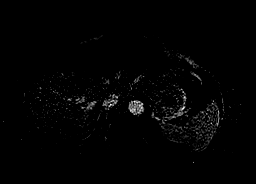

[Series 18: T1 dynamic post-contrast · coronal · 2.5mm · 0.74mm/px · 2 of 52 slices shown]
[im 1/52]
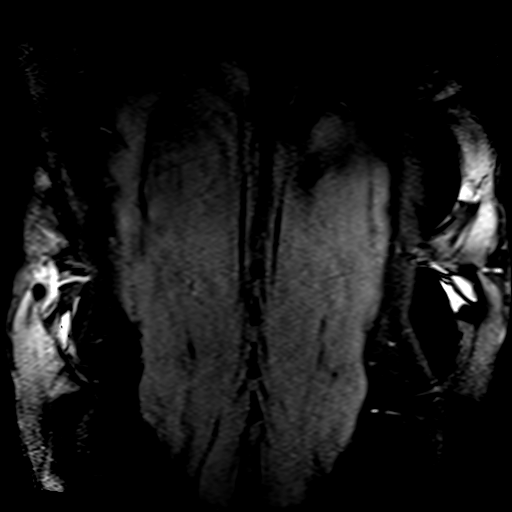
[im 52/52]
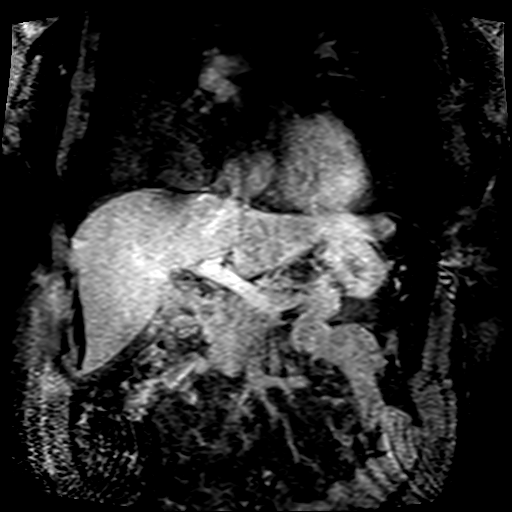

[Series 19: post axial 3+ · axial · 2.3mm · 1.45mm/px · z∈[-161,+21]mm · 3 of 80 slices shown (1 of 2)]
[im 1/80]
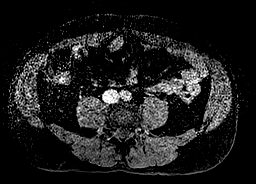
[im 40/80]
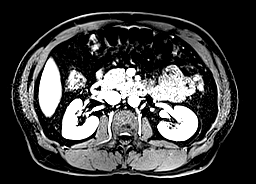
[im 80/80]
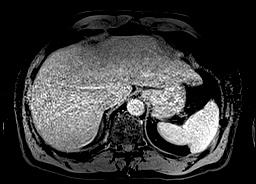

[Series 20: post axial 3+ · axial · 2.3mm · 1.45mm/px · z∈[-161,+21]mm · 3 of 80 slices shown (2 of 2)]
[im 1/80]
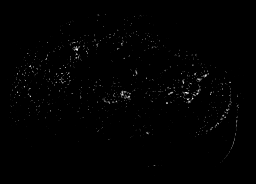
[im 40/80]
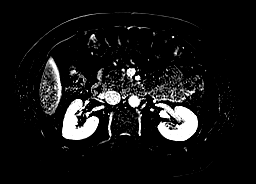
[im 80/80]
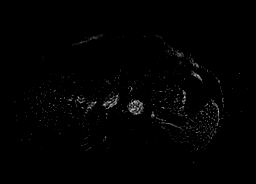

[48 of 48 positions shown; findings below may reference images not displayed]

FINDINGS: Lower chest: No acute findings.

Hepatobiliary: No hepatic masses identified. Gallbladder is
unremarkable. No evidence of biliary ductal dilatation.

Pancreas:  No mass or inflammatory changes.

Spleen:  Within normal limits in size and appearance.

Adrenals/Urinary Tract: Normal appearance adrenal glands and right
kidney. 11 mm simple cyst is seen in the lower pole of the left
kidney. A 8 mm simple cyst is seen in the posterior upper pole of
the left kidney, and adjacent to this is a 7 mm benign Bosniak
category 2 hemorrhagic cyst. This corresponds to the lesion seen on
recent chest CT. No other complex cystic or solid renal masses are
identified. No evidence hydronephrosis.

Stomach/Bowel: Visualized portion unremarkable.

Vascular/Lymphatic: No pathologically enlarged lymph nodes
identified. No acute vascular findings.

Other:  None.

Musculoskeletal:  No suspicious bone lesions identified.
IMPRESSION: Small benign Bosniak category 1 and 2 cysts in the left kidney,
corresponding to the lesion seen on recent chest CT. No evidence of
renal neoplasm or other significant abnormality.

## 2021-07-30 MED ORDER — GADOBENATE DIMEGLUMINE 529 MG/ML IV SOLN
17.0000 mL | Freq: Once | INTRAVENOUS | Status: AC | PRN
  Administered 2021-07-30: 17 mL via INTRAVENOUS

## 2021-09-10 ENCOUNTER — Ambulatory Visit: Payer: Self-pay | Admitting: Surgery

## 2021-09-15 ENCOUNTER — Encounter (HOSPITAL_BASED_OUTPATIENT_CLINIC_OR_DEPARTMENT_OTHER): Payer: Self-pay | Admitting: Surgery

## 2021-09-15 ENCOUNTER — Other Ambulatory Visit: Payer: Self-pay

## 2021-09-15 NOTE — Progress Notes (Signed)
Spoke w/ via phone for pre-op interview---pt Lab needs dos----  none             Lab results------chest ct 06-26-2021 epic COVID test -----patient states asymptomatic no test needed Arrive at -------530 am 09-18-2021 NPO after MN NO Solid Food.  Clear liquids from MN until---430 am Med rec completed Medications to take morning of surgery -----atorvastatin, protonix Diabetic medication -----n/a Patient instructed no nail polish to be worn day of surgery Patient instructed to bring photo id and insurance card day of surgery Patient aware to have Driver (ride ) / caregiver    for 24 hours after surgery  wife natalie Patient Special Instructions -----none Pre-Op special Istructions -----none Patient verbalized understanding of instructions that were given at this phone interview. Patient denies shortness of breath, chest pain, fever, cough at this phone interview.

## 2021-09-17 NOTE — Anesthesia Preprocedure Evaluation (Addendum)
Anesthesia Evaluation  Patient identified by MRN, date of birth, ID band Patient awake    Reviewed: Allergy & Precautions, H&P , NPO status , Patient's Chart, lab work & pertinent test results  History of Anesthesia Complications (+) PONV  Airway Mallampati: II  TM Distance: >3 FB Neck ROM: Full    Dental no notable dental hx.    Pulmonary neg pulmonary ROS, former smoker,    Pulmonary exam normal breath sounds clear to auscultation       Cardiovascular negative cardio ROS Normal cardiovascular exam Rhythm:Regular Rate:Normal     Neuro/Psych negative neurological ROS  negative psych ROS   GI/Hepatic Neg liver ROS, hiatal hernia, GERD  ,  Endo/Other  negative endocrine ROS  Renal/GU negative Renal ROS  negative genitourinary   Musculoskeletal  (+) Arthritis , AS   Abdominal   Peds negative pediatric ROS (+)  Hematology negative hematology ROS (+)   Anesthesia Other Findings   Reproductive/Obstetrics negative OB ROS                            Anesthesia Physical Anesthesia Plan  ASA: 2  Anesthesia Plan: General   Post-op Pain Management:    Induction: Intravenous  PONV Risk Score and Plan: 3 and Ondansetron, Dexamethasone, Midazolam, Treatment may vary due to age or medical condition and Droperidol  Airway Management Planned: Oral ETT  Additional Equipment:   Intra-op Plan:   Post-operative Plan: Extubation in OR  Informed Consent: I have reviewed the patients History and Physical, chart, labs and discussed the procedure including the risks, benefits and alternatives for the proposed anesthesia with the patient or authorized representative who has indicated his/her understanding and acceptance.     Dental advisory given  Plan Discussed with: CRNA and Surgeon  Anesthesia Plan Comments:         Anesthesia Quick Evaluation

## 2021-09-18 ENCOUNTER — Encounter (HOSPITAL_BASED_OUTPATIENT_CLINIC_OR_DEPARTMENT_OTHER)
Admission: RE | Disposition: A | Discharge status: Home / Self Care | Payer: Self-pay | Source: Home / Self Care | Attending: Surgery

## 2021-09-18 ENCOUNTER — Ambulatory Visit (HOSPITAL_BASED_OUTPATIENT_CLINIC_OR_DEPARTMENT_OTHER): Payer: Medicare PPO | Admitting: Certified Registered Nurse Anesthetist

## 2021-09-18 ENCOUNTER — Encounter (HOSPITAL_BASED_OUTPATIENT_CLINIC_OR_DEPARTMENT_OTHER): Payer: Self-pay | Admitting: Surgery

## 2021-09-18 ENCOUNTER — Ambulatory Visit (HOSPITAL_BASED_OUTPATIENT_CLINIC_OR_DEPARTMENT_OTHER)
Admission: RE | Admit: 2021-09-18 | Discharge: 2021-09-18 | Disposition: A | Discharge status: Home / Self Care | Payer: Medicare PPO | Attending: Surgery | Admitting: Surgery

## 2021-09-18 DIAGNOSIS — Z87891 Personal history of nicotine dependence: Secondary | ICD-10-CM | POA: Diagnosis not present

## 2021-09-18 DIAGNOSIS — E785 Hyperlipidemia, unspecified: Secondary | ICD-10-CM | POA: Diagnosis not present

## 2021-09-18 DIAGNOSIS — L723 Sebaceous cyst: Secondary | ICD-10-CM | POA: Insufficient documentation

## 2021-09-18 DIAGNOSIS — D171 Benign lipomatous neoplasm of skin and subcutaneous tissue of trunk: Secondary | ICD-10-CM | POA: Insufficient documentation

## 2021-09-18 DIAGNOSIS — M1990 Unspecified osteoarthritis, unspecified site: Secondary | ICD-10-CM | POA: Diagnosis not present

## 2021-09-18 DIAGNOSIS — K449 Diaphragmatic hernia without obstruction or gangrene: Secondary | ICD-10-CM | POA: Diagnosis not present

## 2021-09-18 DIAGNOSIS — K219 Gastro-esophageal reflux disease without esophagitis: Secondary | ICD-10-CM | POA: Insufficient documentation

## 2021-09-18 HISTORY — DX: Sebaceous cyst: L72.3

## 2021-09-18 HISTORY — DX: Ankylosing spondylitis of unspecified sites in spine: M45.9

## 2021-09-18 HISTORY — DX: Cyst of kidney, acquired: N28.1

## 2021-09-18 HISTORY — DX: Family history of other specified conditions: Z84.89

## 2021-09-18 HISTORY — PX: CYST EXCISION: SHX5701

## 2021-09-18 HISTORY — DX: Presence of spectacles and contact lenses: Z97.3

## 2021-09-18 SURGERY — CYST REMOVAL
Anesthesia: General | Site: Back

## 2021-09-18 MED ORDER — DEXAMETHASONE SODIUM PHOSPHATE 10 MG/ML IJ SOLN
INTRAMUSCULAR | Status: DC | PRN
  Administered 2021-09-18: 10 mg via INTRAVENOUS

## 2021-09-18 MED ORDER — KETOROLAC TROMETHAMINE 30 MG/ML IJ SOLN
INTRAMUSCULAR | Status: DC | PRN
  Administered 2021-09-18: 30 mg via INTRAVENOUS

## 2021-09-18 MED ORDER — OXYCODONE-ACETAMINOPHEN 5-325 MG PO TABS: 1.0000 | ORAL_TABLET | ORAL | 0 refills | Status: AC | PRN

## 2021-09-18 MED ORDER — DEXAMETHASONE SODIUM PHOSPHATE 10 MG/ML IJ SOLN
INTRAMUSCULAR | Status: AC
  Filled 2021-09-18: qty 1

## 2021-09-18 MED ORDER — LACTATED RINGERS IV SOLN: INTRAVENOUS | Status: DC

## 2021-09-18 MED ORDER — PROPOFOL 10 MG/ML IV BOLUS
INTRAVENOUS | Status: AC
  Filled 2021-09-18: qty 20

## 2021-09-18 MED ORDER — MIDAZOLAM HCL 5 MG/5ML IJ SOLN
INTRAMUSCULAR | Status: DC | PRN
  Administered 2021-09-18: 2 mg via INTRAVENOUS

## 2021-09-18 MED ORDER — ROCURONIUM BROMIDE 10 MG/ML (PF) SYRINGE
PREFILLED_SYRINGE | INTRAVENOUS | Status: AC
  Filled 2021-09-18: qty 10

## 2021-09-18 MED ORDER — LIDOCAINE 2% (20 MG/ML) 5 ML SYRINGE
INTRAMUSCULAR | Status: DC | PRN
  Administered 2021-09-18: 100 mg via INTRAVENOUS

## 2021-09-18 MED ORDER — FENTANYL CITRATE (PF) 250 MCG/5ML IJ SOLN
INTRAMUSCULAR | Status: AC
  Filled 2021-09-18: qty 5

## 2021-09-18 MED ORDER — FENTANYL CITRATE (PF) 100 MCG/2ML IJ SOLN
INTRAMUSCULAR | Status: DC | PRN
  Administered 2021-09-18: 100 ug via INTRAVENOUS

## 2021-09-18 MED ORDER — ONDANSETRON HCL 4 MG/2ML IJ SOLN
INTRAMUSCULAR | Status: AC
  Filled 2021-09-18: qty 2

## 2021-09-18 MED ORDER — LIDOCAINE 2% (20 MG/ML) 5 ML SYRINGE
INTRAMUSCULAR | Status: AC
  Filled 2021-09-18: qty 5

## 2021-09-18 MED ORDER — CHLORHEXIDINE GLUCONATE CLOTH 2 % EX PADS: 6.0000 | MEDICATED_PAD | Freq: Once | CUTANEOUS | Status: DC

## 2021-09-18 MED ORDER — SCOPOLAMINE 1 MG/3DAYS TD PT72
MEDICATED_PATCH | TRANSDERMAL | Status: AC
  Filled 2021-09-18: qty 1

## 2021-09-18 MED ORDER — MIDAZOLAM HCL 2 MG/2ML IJ SOLN
INTRAMUSCULAR | Status: AC
  Filled 2021-09-18: qty 2

## 2021-09-18 MED ORDER — PROMETHAZINE HCL 25 MG/ML IJ SOLN: 6.2500 mg | INTRAMUSCULAR | Status: DC | PRN

## 2021-09-18 MED ORDER — CEFAZOLIN SODIUM-DEXTROSE 2-4 GM/100ML-% IV SOLN
2.0000 g | INTRAVENOUS | Status: AC
  Administered 2021-09-18: 2 g via INTRAVENOUS

## 2021-09-18 MED ORDER — PHENYLEPHRINE 40 MCG/ML (10ML) SYRINGE FOR IV PUSH (FOR BLOOD PRESSURE SUPPORT)
PREFILLED_SYRINGE | INTRAVENOUS | Status: AC
  Filled 2021-09-18: qty 10

## 2021-09-18 MED ORDER — KETOROLAC TROMETHAMINE 30 MG/ML IJ SOLN: 30.0000 mg | Freq: Once | INTRAMUSCULAR | Status: DC | PRN

## 2021-09-18 MED ORDER — ROCURONIUM BROMIDE 10 MG/ML (PF) SYRINGE
PREFILLED_SYRINGE | INTRAVENOUS | Status: DC | PRN
  Administered 2021-09-18: 60 mg via INTRAVENOUS

## 2021-09-18 MED ORDER — CEFAZOLIN SODIUM-DEXTROSE 2-4 GM/100ML-% IV SOLN
INTRAVENOUS | Status: AC
  Filled 2021-09-18: qty 100

## 2021-09-18 MED ORDER — ONDANSETRON HCL 4 MG/2ML IJ SOLN
INTRAMUSCULAR | Status: DC | PRN
  Administered 2021-09-18: 4 mg via INTRAVENOUS

## 2021-09-18 MED ORDER — 0.9 % SODIUM CHLORIDE (POUR BTL) OPTIME
TOPICAL | Status: DC | PRN
  Administered 2021-09-18: 500 mL

## 2021-09-18 MED ORDER — WHITE PETROLATUM EX OINT
TOPICAL_OINTMENT | CUTANEOUS | Status: AC
  Filled 2021-09-18: qty 5

## 2021-09-18 MED ORDER — PHENYLEPHRINE 40 MCG/ML (10ML) SYRINGE FOR IV PUSH (FOR BLOOD PRESSURE SUPPORT)
PREFILLED_SYRINGE | INTRAVENOUS | Status: DC | PRN
  Administered 2021-09-18: 80 ug via INTRAVENOUS

## 2021-09-18 MED ORDER — SCOPOLAMINE 1 MG/3DAYS TD PT72
MEDICATED_PATCH | TRANSDERMAL | Status: DC | PRN
  Administered 2021-09-18: 1 mg via TRANSDERMAL

## 2021-09-18 MED ORDER — KETOROLAC TROMETHAMINE 30 MG/ML IJ SOLN
INTRAMUSCULAR | Status: AC
  Filled 2021-09-18: qty 1

## 2021-09-18 MED ORDER — BUPIVACAINE-EPINEPHRINE 0.25% -1:200000 IJ SOLN
INTRAMUSCULAR | Status: DC | PRN
  Administered 2021-09-18: 14 mL

## 2021-09-18 MED ORDER — SUGAMMADEX SODIUM 200 MG/2ML IV SOLN
INTRAVENOUS | Status: DC | PRN
  Administered 2021-09-18: 200 mg via INTRAVENOUS

## 2021-09-18 MED ORDER — BACITRACIN-NEOMYCIN-POLYMYXIN 400-5-5000 EX OINT
TOPICAL_OINTMENT | CUTANEOUS | Status: DC | PRN
  Administered 2021-09-18: 1 via TOPICAL

## 2021-09-18 MED ORDER — PROPOFOL 10 MG/ML IV BOLUS
INTRAVENOUS | Status: DC | PRN
  Administered 2021-09-18: 150 mg via INTRAVENOUS

## 2021-09-18 MED ORDER — FENTANYL CITRATE (PF) 100 MCG/2ML IJ SOLN: 25.0000 ug | INTRAMUSCULAR | Status: DC | PRN

## 2021-09-18 SURGICAL SUPPLY — 41 items
ADH SKN CLS APL DERMABOND .7 (GAUZE/BANDAGES/DRESSINGS) ×1
APL PRP STRL LF DISP 70% ISPRP (MISCELLANEOUS)
BLADE SURG 15 STRL LF DISP TIS (BLADE) ×1 IMPLANT
BLADE SURG 15 STRL SS (BLADE) ×2
CHLORAPREP W/TINT 26 (MISCELLANEOUS) IMPLANT
COVER BACK TABLE 60X90IN (DRAPES) ×2 IMPLANT
COVER MAYO STAND STRL (DRAPES) ×2 IMPLANT
DERMABOND ADVANCED (GAUZE/BANDAGES/DRESSINGS) ×1
DERMABOND ADVANCED .7 DNX12 (GAUZE/BANDAGES/DRESSINGS) ×1 IMPLANT
DRAPE LAPAROTOMY 100X72 PEDS (DRAPES) IMPLANT
DRAPE UTILITY XL STRL (DRAPES) ×2 IMPLANT
ELECT REM PT RETURN 9FT ADLT (ELECTROSURGICAL) ×2
ELECTRODE REM PT RTRN 9FT ADLT (ELECTROSURGICAL) ×1 IMPLANT
GAUZE 4X4 16PLY ~~LOC~~+RFID DBL (SPONGE) ×2 IMPLANT
GAUZE SPONGE 4X4 12PLY STRL (GAUZE/BANDAGES/DRESSINGS) ×2 IMPLANT
GLOVE SRG 8 PF TXTR STRL LF DI (GLOVE) ×1 IMPLANT
GLOVE SURG PR MICRO ENCORE 7.5 (GLOVE) ×2 IMPLANT
GLOVE SURG UNDER POLY LF SZ8 (GLOVE) ×2
GOWN STRL REUS W/TWL LRG LVL3 (GOWN DISPOSABLE) ×2 IMPLANT
KIT TURNOVER CYSTO (KITS) ×2 IMPLANT
NEEDLE HYPO 22GX1.5 SAFETY (NEEDLE) IMPLANT
NEEDLE HYPO 25X1 1.5 SAFETY (NEEDLE) IMPLANT
PACK BASIN DAY SURGERY FS (CUSTOM PROCEDURE TRAY) ×2 IMPLANT
PENCIL SMOKE EVACUATOR (MISCELLANEOUS) ×2 IMPLANT
SPONGE T-LAP 18X18 ~~LOC~~+RFID (SPONGE) IMPLANT
SPONGE T-LAP 4X18 ~~LOC~~+RFID (SPONGE) IMPLANT
SUCTION FRAZIER HANDLE 10FR (MISCELLANEOUS)
SUCTION TUBE FRAZIER 10FR DISP (MISCELLANEOUS) IMPLANT
SUT CHROMIC 3 0 SH 27 (SUTURE) IMPLANT
SUT ETHILON 2 0 FS 18 (SUTURE) ×4 IMPLANT
SUT MNCRL AB 4-0 PS2 18 (SUTURE) ×2 IMPLANT
SUT VIC AB 2-0 SH 27 (SUTURE)
SUT VIC AB 2-0 SH 27XBRD (SUTURE) IMPLANT
SUT VIC AB 3-0 SH 18 (SUTURE) ×2 IMPLANT
SUT VIC AB 3-0 SH 27 (SUTURE) ×4
SUT VIC AB 3-0 SH 27X BRD (SUTURE) ×2 IMPLANT
SYR BULB IRRIG 60ML STRL (SYRINGE) ×2 IMPLANT
SYR CONTROL 10ML LL (SYRINGE) ×2 IMPLANT
TOWEL OR 17X26 10 PK STRL BLUE (TOWEL DISPOSABLE) ×2 IMPLANT
TUBE CONNECTING 12X1/4 (SUCTIONS) ×2 IMPLANT
YANKAUER SUCT BULB TIP NO VENT (SUCTIONS) ×2 IMPLANT

## 2021-09-18 NOTE — Anesthesia Procedure Notes (Signed)
Procedure Name: Intubation Date/Time: 09/18/2021 7:44 AM Performed by: Rogers Blocker, CRNA Pre-anesthesia Checklist: Patient identified, Emergency Drugs available, Suction available and Patient being monitored Patient Re-evaluated:Patient Re-evaluated prior to induction Oxygen Delivery Method: Circle System Utilized Preoxygenation: Pre-oxygenation with 100% oxygen Induction Type: IV induction Ventilation: Mask ventilation without difficulty Laryngoscope Size: Mac and 4 Grade View: Grade II Tube type: Oral Tube size: 7.5 mm Number of attempts: 1 Airway Equipment and Method: Stylet and Oral airway Placement Confirmation: ETT inserted through vocal cords under direct vision, positive ETCO2 and breath sounds checked- equal and bilateral Secured at: 22 cm Tube secured with: Tape Dental Injury: Teeth and Oropharynx as per pre-operative assessment

## 2021-09-18 NOTE — Transfer of Care (Signed)
Immediate Anesthesia Transfer of Care Note  Patient: Daniel [name]  Procedure(s) Performed: EXCISION OF BACK SEBACEOUS CYSTS (Back)  Patient Location: PACU  Anesthesia Type:General  Level of Consciousness: awake, alert , oriented and patient cooperative  Airway & Oxygen Therapy: Patient Spontanous Breathing and Patient connected to face mask oxygen  Post-op Assessment: Report given to RN and Post -op Vital signs reviewed and stable  Post vital signs: Reviewed and stable  Last Vitals:  Vitals Value Taken Time  BP 140/95 09/18/21 0848  Temp    Pulse 75 09/18/21 0849  Resp 12 09/18/21 0849  SpO2 100 % 09/18/21 0849  Vitals shown include unvalidated device data.  Last Pain:  Vitals:   09/18/21 0601  TempSrc: Oral  PainSc: 0-No pain      Patients Stated Pain Goal: 4 (01/65/80 0634)  Complications: No notable events documented.

## 2021-09-18 NOTE — H&P (Signed)
Admitting Physician: Nickola Major Kaelynne Christley  Service: General surgery  CC: back cysts  Subjective   HPI: Daniel [name] is an 60 y.o. male who is here for excision of two back cysts  Past Medical History:  Diagnosis Date   Ankylosing spondylitis ([place])    of whole spine   Anxiety    Arthritis    oa in hands   Avascular necrosis of bone of hip, left ([place]) 04/26/2017   Coronary atherosclerosis    lad coronary artery calcification per chest ct 06-26-2021 epic   Cubital tunnel syndrome on left 08/2014   Cyst of left kidney    per mr abdomen 07-30-2021 epic   Dental bridge present    lower left   Dental crown present    lower right   Family history of adverse reaction to anesthesia    hx of ponv   GERD (gastroesophageal reflux disease)    Head trauma 2012   no residual deficit from   Hiatal hernia    Hyperlipidemia    Insomnia    no meds taken   PONV (postoperative nausea and vomiting)    Pulmonary nodule    Sebaceous cyst    back   Tubular adenoma of colon    Ulnar artery thrombus, 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   Wears glasses    for reading    Past Surgical History:  Procedure Laterality Date   ARTERY REPAIR Right 08/30/2014   Procedure: RIGHT ULNAR ARTERY RESECTION WITH VEIN GRAFT;  Surgeon: Charlotte Crumb, MD;  Location: [facility];  Service: Orthopedics;  Laterality: Right;   CERVICAL SPINE SURGERY  11/14/2020   C 4 to C 6 rods and steel plate in place   ELBOW ARTHROSCOPY WITH TENDON RECONSTRUCTION Left 07/01/2011   ECRL and brevis   HERNIA REPAIR  1998   TONSILLECTOMY     as child   TOTAL HIP ARTHROPLASTY Left 04/26/2017   TOTAL HIP ARTHROPLASTY Left 04/26/2017   Procedure: LEFT TOTAL HIP ARTHROPLASTY;  Surgeon: Marchia Bond, MD;  Location: [facility];  Service: Orthopedics;  Laterality: Left;   ULNAR TUNNEL RELEASE Right 08/30/2014   Procedure: RIGHT CUBITAL TUNNEL RELEASE;  Surgeon: Charlotte Crumb, MD;  Location: [facility];   Service: Orthopedics;  Laterality: Right;   UMBILICAL HERNIA REPAIR     WRIST ARTHROSCOPY WITH ULNA SHORTENING Right 12/28/2007    Family History  Problem Relation Age of Onset   Osteoarthritis Mother    Hypertension Mother    Fibromyalgia Mother    Kidney disease Mother    Anxiety disorder Mother    CAD Father    Heart attack Father 59   Heart failure Father    Hypertension Father    Ovarian cancer Sister    Colon cancer Neg Hx     Social:  reports that he has quit smoking. He has never used smokeless tobacco. He reports current alcohol use. He reports that he does not currently use drugs.  Allergies:  Allergies  Allergen Reactions   Vicodin [Hydrocodone-Acetaminophen] Itching    Medications: Current Outpatient Medications  Medication Instructions   aspirin EC 81 mg, Oral, Daily at bedtime   atorvastatin (LIPITOR) 10 mg, Oral, Daily   citalopram (CELEXA) 40 mg, Oral, Daily at bedtime   Multiple Vitamins-Minerals (MULTIVITAMIN WITH MINERALS) tablet 1 tablet, Oral, Daily, centrum   pantoprazole (PROTONIX) 20 mg, Oral, Daily    ROS - all of the below systems have been reviewed with the patient and  positives are indicated with bold text General: chills, fever or night sweats Eyes: blurry vision or double vision ENT: epistaxis or sore throat Allergy/Immunology: itchy/watery eyes or nasal congestion Hematologic/Lymphatic: bleeding problems, blood clots or swollen lymph nodes Endocrine: temperature intolerance or unexpected weight changes Breast: new or changing breast lumps or nipple discharge Resp: cough, shortness of breath, or wheezing CV: chest pain or dyspnea on exertion GI: as per HPI GU: dysuria, trouble voiding, or hematuria MSK: joint pain or joint stiffness Neuro: TIA or stroke symptoms Derm: pruritus and skin lesion changes Psych: anxiety and depression  Objective   PE Blood pressure 132/88, pulse 62, temperature 97.7 F (36.5 C), temperature source  Oral, resp. rate 17, height 6\' 2"  (1.88 m), weight 81.5 kg, SpO2 100 %. Constitutional: NAD; conversant; no deformities Eyes: Moist conjunctiva; no lid lag; anicteric; PERRL Neck: Trachea midline; no thyromegaly Lungs: Normal respiratory effort; no tactile fremitus CV: RRR; no palpable thrills; no pitting edema GI: Abd soft, nontender; no palpable hepatosplenomegaly MSK: Normal range of motion of extremities; no clubbing/cyanosis Psychiatric: Appropriate affect; alert and oriented x3 Lymphatic: No palpable cervical or axillary lymphadenopathy Back - two cysts/subcutaneous masses upper back.  No results found for this or any previous visit (from the past 24 hour(s)).  Imaging Orders  No imaging studies ordered today     Assessment and Plan   Daniel [name] is an 60 y.o. male with two back cysts here for excision.  The risks, benefits and alternatives were discussed and the patient granted consent to proceed.  We will proceed as scheduled.    Felicie Morn, MD  [facility] Use [email or web address] to contact on call provider

## 2021-09-18 NOTE — Discharge Instructions (Addendum)
  Post Anesthesia Home Care Instructions  Activity: Get plenty of rest for the remainder of the day. A responsible individual must stay with you for 24 hours following the procedure.  For the next 24 hours, DO NOT: -Drive a car -Paediatric nurse -Drink alcoholic beverages -Take any medication unless instructed by your physician -Make any legal decisions or sign important papers.  Meals: Start with liquid foods such as gelatin or soup. Progress to regular foods as tolerated. Avoid greasy, spicy, heavy foods. If nausea and/or vomiting occur, drink only clear liquids until the nausea and/or vomiting subsides. Call your physician if vomiting continues.  Special Instructions/Symptoms: Your throat may feel dry or sore from the anesthesia or the breathing tube placed in your throat during surgery. If this causes discomfort, gargle with warm salt water. The discomfort should disappear within 24 hours.  If you had a scopolamine patch placed behind your ear for the management of post- operative nausea and/or vomiting:  1. The medication in the patch is effective for 72 hours, after which it should be removed.  Wrap patch in a tissue and discard in the trash. Wash hands thoroughly with soap and water. 2. You may remove the patch earlier than 72 hours if you experience unpleasant side effects which may include dry mouth, dizziness or visual disturbances. 3. Avoid touching the patch. Wash your hands with soap and water after contact with the patch.    Remove patch behind right ear by Monday, September 21, 2021.  May take Ibuprofen, Advil, Motrin, or Aleve after 2 PM today.

## 2021-09-18 NOTE — Anesthesia Postprocedure Evaluation (Signed)
Anesthesia Post Note  Patient: Daniel [name]  Procedure(s) Performed: EXCISION OF BACK SEBACEOUS CYSTS (Back)     Patient location during evaluation: PACU Anesthesia Type: General Level of consciousness: awake and alert Pain management: pain level controlled Vital Signs Assessment: post-procedure vital signs reviewed and stable Respiratory status: spontaneous breathing, nonlabored ventilation, respiratory function stable and patient connected to nasal cannula oxygen Cardiovascular status: blood pressure returned to baseline and stable Postop Assessment: no apparent nausea or vomiting Anesthetic complications: no   No notable events documented.  Last Vitals:  Vitals:   09/18/21 0848 09/18/21 0900  BP: (!) 140/95 125/87  Pulse: 84 71  Resp: 11 11  Temp: 36.4 C   SpO2: 98% 100%    Last Pain:  Vitals:   09/18/21 0848  TempSrc:   PainSc: 0-No pain                 Jaiona Simien S

## 2021-09-18 NOTE — Op Note (Signed)
Daniel [name]: Daniel Daniel [name] (1961-04-03, [id number])  Date of Surgery: 09/18/2021   Preoperative Diagnosis: BACK SEBACEOUS CYSTS   Postoperative Diagnosis: BACK LIPOMA AND SEBACEOUS CYST  Surgical Procedure: EXCISION OF BACK LIPOMA AND SEBACEOUS CYST    Operative Team Members:  Surgeon(s) and Role:    * Jayziah Bankhead, Nickola Major, MD - Primary   Anesthesiologist: Myrtie Soman, MD CRNA: Rogers Blocker, CRNA; Suan Halter, CRNA   Anesthesia: General   Fluids:  Total I/O In: 800 [I.V.:700; IV Piggyback:100] Out: 2 [OQHUT:6]  Complications: None  Drains:  none   Specimen:  ID Type Source Tests Collected by Time Destination  1 : inferior back cyst Tissue PATH Soft tissue SURGICAL PATHOLOGY Faythe Heitzenrater, Nickola Major, MD 09/18/2021 0800   2 : superior back subcutaneous mass Tissue PATH Soft tissue SURGICAL PATHOLOGY Helia Haese, Nickola Major, MD 09/18/2021 0804      Disposition:  PACU - hemodynamically stable.  Plan of Care: Discharge to home after PACU    Indications for Procedure: Daniel Daniel [name] is a 60 y.o. male who presented with two back subcutaneous masses which seemed to be sebaceous cysts on exam.  I recommended removal.  The procedure itself as well as its risks, benefits and alternatives were discussed.  The risks discussed included but were not limited to the risk of infection, bleeding, damage to nearby structures, and wound healing issues.  After a full discussion and all questions answered the Daniel [name] granted consent to proceed.  Findings: Superior mass lipoma ~6cm x 4 cm x 1 cm Inferior sebaceous cyst ~1cm x 1 cm x 1 cm   Description of Procedure:   On the date stated above the Daniel [name] was placed under general anesthesia.  He was rolled into prone positioning.  His back was prepped and draped in usual sterile fashion.  A timeout was completed verifying the correct Daniel [name], procedure, position, and equipment needed for the case.  The inferior sebaceous cyst was  addressed first.  The area was anesthetized.  An elliptical incision was created around the head of the cyst.  Electrocautery was used to excise the cyst wall in total.  It measured about 1 cm x 1 cm x 1 cm.  It was passed off the field as a specimen.  The wound was irrigated and hemostasis was obtained with electrocautery.  Vicryl sutures were used to bring together the deep tissues.  2-0 nylon was run to close the skin.  Triple antibiotic ointment and a bandage was applied.  I then addressed the superior mass.  It seemed it may also be a sebaceous cyst, however there was no clear head so I started by injecting local around the area and then making a linear incision.  I dissected down through the skin to the subcutaneous tissues and encountered what appeared to be a lipoma.  I dissected the lipoma out circumferentially using electrocautery and obtained hemostasis using electrocautery.  The lipoma was removed and passed off the field as the second specimen.  Hemostasis was obtained with electrocautery and Vicryl sutures.  Multiple Vicryl sutures were used to close the cavity and reapproximate the deep tissues.  The skin was closed using running 2-0 nylon and then triple antibiotic ointment and a bandage was applied.  All sponge and needle counts were correct at the end of this case.  At the end of the case we reviewed the infection status of the case. Daniel [name]: Daniel Daniel [name] Elective Case Case: Elective Infection Present At Time Of Surgery (PATOS): None  Louanna Raw, MD General, Bariatric, & Minimally Invasive Surgery [facility], [place]

## 2021-09-21 ENCOUNTER — Encounter (HOSPITAL_BASED_OUTPATIENT_CLINIC_OR_DEPARTMENT_OTHER): Payer: Self-pay | Admitting: Surgery

## 2021-09-21 LAB — SURGICAL PATHOLOGY

## 2022-01-22 DIAGNOSIS — Z125 Encounter for screening for malignant neoplasm of prostate: Secondary | ICD-10-CM | POA: Diagnosis not present

## 2022-01-22 DIAGNOSIS — E785 Hyperlipidemia, unspecified: Secondary | ICD-10-CM | POA: Diagnosis not present

## 2022-01-22 DIAGNOSIS — R030 Elevated blood-pressure reading, without diagnosis of hypertension: Secondary | ICD-10-CM | POA: Diagnosis not present

## 2022-01-22 DIAGNOSIS — R7989 Other specified abnormal findings of blood chemistry: Secondary | ICD-10-CM | POA: Diagnosis not present

## 2022-01-29 DIAGNOSIS — M1990 Unspecified osteoarthritis, unspecified site: Secondary | ICD-10-CM | POA: Diagnosis not present

## 2022-01-29 DIAGNOSIS — R82998 Other abnormal findings in urine: Secondary | ICD-10-CM | POA: Diagnosis not present

## 2022-01-29 DIAGNOSIS — M5030 Other cervical disc degeneration, unspecified cervical region: Secondary | ICD-10-CM | POA: Diagnosis not present

## 2022-01-29 DIAGNOSIS — I2510 Atherosclerotic heart disease of native coronary artery without angina pectoris: Secondary | ICD-10-CM | POA: Diagnosis not present

## 2022-01-29 DIAGNOSIS — K219 Gastro-esophageal reflux disease without esophagitis: Secondary | ICD-10-CM | POA: Diagnosis not present

## 2022-01-29 DIAGNOSIS — Z1331 Encounter for screening for depression: Secondary | ICD-10-CM | POA: Diagnosis not present

## 2022-01-29 DIAGNOSIS — Z0000 Encounter for general adult medical examination without abnormal findings: Secondary | ICD-10-CM | POA: Diagnosis not present

## 2022-01-29 DIAGNOSIS — F418 Other specified anxiety disorders: Secondary | ICD-10-CM | POA: Diagnosis not present

## 2022-01-29 DIAGNOSIS — G4700 Insomnia, unspecified: Secondary | ICD-10-CM | POA: Diagnosis not present

## 2022-01-29 DIAGNOSIS — E785 Hyperlipidemia, unspecified: Secondary | ICD-10-CM | POA: Diagnosis not present

## 2022-02-16 DIAGNOSIS — H11153 Pinguecula, bilateral: Secondary | ICD-10-CM | POA: Diagnosis not present

## 2022-02-16 DIAGNOSIS — H2513 Age-related nuclear cataract, bilateral: Secondary | ICD-10-CM | POA: Diagnosis not present

## 2022-02-16 DIAGNOSIS — H524 Presbyopia: Secondary | ICD-10-CM | POA: Diagnosis not present

## 2022-02-16 DIAGNOSIS — H5212 Myopia, left eye: Secondary | ICD-10-CM | POA: Diagnosis not present

## 2022-02-16 DIAGNOSIS — H04123 Dry eye syndrome of bilateral lacrimal glands: Secondary | ICD-10-CM | POA: Diagnosis not present

## 2022-03-02 DIAGNOSIS — M79641 Pain in right hand: Secondary | ICD-10-CM | POA: Diagnosis not present

## 2022-03-02 DIAGNOSIS — M79642 Pain in left hand: Secondary | ICD-10-CM | POA: Diagnosis not present

## 2022-03-17 ENCOUNTER — Encounter: Payer: Self-pay | Admitting: Internal Medicine

## 2022-04-06 DIAGNOSIS — M79641 Pain in right hand: Secondary | ICD-10-CM | POA: Diagnosis not present

## 2022-04-06 DIAGNOSIS — M79642 Pain in left hand: Secondary | ICD-10-CM | POA: Diagnosis not present

## 2023-02-11 DIAGNOSIS — Z125 Encounter for screening for malignant neoplasm of prostate: Secondary | ICD-10-CM | POA: Diagnosis not present

## 2023-02-11 DIAGNOSIS — R7989 Other specified abnormal findings of blood chemistry: Secondary | ICD-10-CM | POA: Diagnosis not present

## 2023-02-11 DIAGNOSIS — K219 Gastro-esophageal reflux disease without esophagitis: Secondary | ICD-10-CM | POA: Diagnosis not present

## 2023-02-11 DIAGNOSIS — E785 Hyperlipidemia, unspecified: Secondary | ICD-10-CM | POA: Diagnosis not present

## 2023-02-11 DIAGNOSIS — R030 Elevated blood-pressure reading, without diagnosis of hypertension: Secondary | ICD-10-CM | POA: Diagnosis not present

## 2023-02-15 DIAGNOSIS — M5030 Other cervical disc degeneration, unspecified cervical region: Secondary | ICD-10-CM | POA: Diagnosis not present

## 2023-02-15 DIAGNOSIS — Z0000 Encounter for general adult medical examination without abnormal findings: Secondary | ICD-10-CM | POA: Diagnosis not present

## 2023-02-15 DIAGNOSIS — F418 Other specified anxiety disorders: Secondary | ICD-10-CM | POA: Diagnosis not present

## 2023-02-15 DIAGNOSIS — M1990 Unspecified osteoarthritis, unspecified site: Secondary | ICD-10-CM | POA: Diagnosis not present

## 2023-02-15 DIAGNOSIS — R030 Elevated blood-pressure reading, without diagnosis of hypertension: Secondary | ICD-10-CM | POA: Diagnosis not present

## 2023-02-15 DIAGNOSIS — E785 Hyperlipidemia, unspecified: Secondary | ICD-10-CM | POA: Diagnosis not present

## 2023-02-15 DIAGNOSIS — I2510 Atherosclerotic heart disease of native coronary artery without angina pectoris: Secondary | ICD-10-CM | POA: Diagnosis not present

## 2023-02-15 DIAGNOSIS — I2584 Coronary atherosclerosis due to calcified coronary lesion: Secondary | ICD-10-CM | POA: Diagnosis not present

## 2023-02-15 DIAGNOSIS — K429 Umbilical hernia without obstruction or gangrene: Secondary | ICD-10-CM | POA: Diagnosis not present

## 2023-02-15 DIAGNOSIS — Z1389 Encounter for screening for other disorder: Secondary | ICD-10-CM | POA: Diagnosis not present

## 2023-02-15 DIAGNOSIS — Z1331 Encounter for screening for depression: Secondary | ICD-10-CM | POA: Diagnosis not present

## 2023-02-15 DIAGNOSIS — R82998 Other abnormal findings in urine: Secondary | ICD-10-CM | POA: Diagnosis not present

## 2023-02-22 DIAGNOSIS — H5212 Myopia, left eye: Secondary | ICD-10-CM | POA: Diagnosis not present

## 2023-02-22 DIAGNOSIS — H2513 Age-related nuclear cataract, bilateral: Secondary | ICD-10-CM | POA: Diagnosis not present

## 2023-02-22 DIAGNOSIS — H11153 Pinguecula, bilateral: Secondary | ICD-10-CM | POA: Diagnosis not present

## 2023-02-22 DIAGNOSIS — H04123 Dry eye syndrome of bilateral lacrimal glands: Secondary | ICD-10-CM | POA: Diagnosis not present

## 2023-02-22 DIAGNOSIS — H524 Presbyopia: Secondary | ICD-10-CM | POA: Diagnosis not present

## 2023-03-30 DIAGNOSIS — I2510 Atherosclerotic heart disease of native coronary artery without angina pectoris: Secondary | ICD-10-CM | POA: Insufficient documentation

## 2023-03-30 DIAGNOSIS — L723 Sebaceous cyst: Secondary | ICD-10-CM | POA: Insufficient documentation

## 2023-03-30 DIAGNOSIS — N281 Cyst of kidney, acquired: Secondary | ICD-10-CM | POA: Insufficient documentation

## 2023-04-07 ENCOUNTER — Ambulatory Visit (AMBULATORY_SURGERY_CENTER): Payer: Medicare PPO

## 2023-04-07 VITALS — Ht 74.0 in | Wt 185.0 lb

## 2023-04-07 DIAGNOSIS — K0889 Other specified disorders of teeth and supporting structures: Secondary | ICD-10-CM | POA: Insufficient documentation

## 2023-04-07 DIAGNOSIS — K429 Umbilical hernia without obstruction or gangrene: Secondary | ICD-10-CM | POA: Insufficient documentation

## 2023-04-07 DIAGNOSIS — Z86010 Personal history of colonic polyps: Secondary | ICD-10-CM

## 2023-04-07 MED ORDER — NA SULFATE-K SULFATE-MG SULF 17.5-3.13-1.6 GM/177ML PO SOLN: 1.0000 | Freq: Once | ORAL | 0 refills | Status: AC

## 2023-04-07 NOTE — Progress Notes (Signed)
No egg or soy allergy known to patient  No issues known to pt with past sedation with any surgeries or procedures : PONV  Patient denies ever being told they had issues or difficulty with intubation  No FH of Malignant Hyperthermia Pt is not on diet pills Pt is not on  home 02  Pt is not on blood thinners  Pt reports occ constipation that sometimes result in blood in stool instructions given for extra miralax  No A fib or A flutter Have any cardiac testing pending--no  Pt is ambulatory   PV completed. Prep instructions reviewed and sent to home address. Pt advised to read all area that are highlighted in the instructions and to call the office if he has any further questions.   Good rx coupon for [facility] will be provided if there are any pricing issues at [facility].Pt instructed to use [email or web address] or GoodRx for a price reduction on prep

## 2023-04-18 ENCOUNTER — Encounter: Payer: Self-pay | Admitting: Internal Medicine

## 2023-05-04 ENCOUNTER — Encounter: Payer: Self-pay | Admitting: Internal Medicine

## 2023-05-04 ENCOUNTER — Ambulatory Visit (AMBULATORY_SURGERY_CENTER): Payer: Medicare PPO | Admitting: Internal Medicine

## 2023-05-04 VITALS — BP 110/69 | HR 71 | Temp 98.6°F | Resp 12 | Ht 74.0 in | Wt 185.0 lb

## 2023-05-04 DIAGNOSIS — Z09 Encounter for follow-up examination after completed treatment for conditions other than malignant neoplasm: Secondary | ICD-10-CM

## 2023-05-04 DIAGNOSIS — Z1211 Encounter for screening for malignant neoplasm of colon: Secondary | ICD-10-CM | POA: Diagnosis not present

## 2023-05-04 DIAGNOSIS — Z86010 Personal history of colonic polyps: Secondary | ICD-10-CM

## 2023-05-04 DIAGNOSIS — F419 Anxiety disorder, unspecified: Secondary | ICD-10-CM | POA: Diagnosis not present

## 2023-05-04 MED ORDER — SODIUM CHLORIDE 0.9 % IV SOLN: 500.0000 mL | Freq: Once | INTRAVENOUS | Status: DC

## 2023-05-04 NOTE — Op Note (Signed)
[facility] Patient Name: Daniel [name] Procedure Date: 05/04/2023 2:01 PM MRN: [id number] Endoscopist: Beverley Fiedler , MD, [id number] Age: 62 Referring MD:  Date of Birth: 08/26/1961 Gender: Male Account #: [id number] Procedure:                Colonoscopy Indications:              High risk colon cancer surveillance: Personal                            history of non-advanced adenoma, Last colonoscopy:                            February 2016 Medicines:                Monitored Anesthesia Care Procedure:                Pre-Anesthesia Assessment:                           - Prior to the procedure, a History and Physical                            was performed, and patient medications and                            allergies were reviewed. The patient's tolerance of                            previous anesthesia was also reviewed. The risks                            and benefits of the procedure and the sedation                            options and risks were discussed with the patient.                            All questions were answered, and informed consent                            was obtained. Prior Anticoagulants: The patient has                            taken no anticoagulant or antiplatelet agents. ASA                            Grade Assessment: II - A patient with mild systemic                            disease. After reviewing the risks and benefits,                            the patient was deemed in satisfactory condition to  undergo the procedure.                           After obtaining informed consent, the colonoscope                            was passed under direct vision. Throughout the                            procedure, the patient's blood pressure, pulse, and                            oxygen saturations were monitored continuously. The                            CF [id number] #[id number] was introduced through the  anus                            and advanced to the cecum, identified by                            appendiceal orifice and ileocecal valve. The                            colonoscopy was performed without difficulty. The                            patient tolerated the procedure well. The quality                            of the bowel preparation was excellent. The                            ileocecal valve, appendiceal orifice, and rectum                            were photographed. Scope In: 2:20:49 PM Scope Out: 2:31:39 PM Scope Withdrawal Time: 0 hours 6 minutes 33 seconds  Total Procedure Duration: 0 hours 10 minutes 50 seconds  Findings:                 The digital rectal exam was normal.                           The colon (entire examined portion) appeared normal.                           Internal hemorrhoids were found during                            retroflexion. The hemorrhoids were small. Complications:            No immediate complications. Estimated Blood Loss:     Estimated blood loss: none. Impression:               - The entire examined colon is normal.                           -  Internal hemorrhoids.                           - No specimens collected. Recommendation:           - Patient has a contact number available for                            emergencies. The signs and symptoms of potential                            delayed complications were discussed with the                            patient. Return to normal activities tomorrow.                            Written discharge instructions were provided to the                            patient.                           - Resume previous diet.                           - Continue present medications.                           - Repeat colonoscopy in 10 years for surveillance. Beverley Fiedler, MD 05/04/2023 2:33:50 PM This report has been signed electronically.

## 2023-05-04 NOTE — Patient Instructions (Signed)
Handout on hemorrhoids given to patient. Resume previous diet and continue present medications. Repeat colonoscopy in 10 years for surveillance!   YOU HAD AN ENDOSCOPIC PROCEDURE TODAY AT THE [facility]:   Refer to the procedure report that was given to you for any specific questions about what was found during the examination.  If the procedure report does not answer your questions, please call your gastroenterologist to clarify.  If you requested that your care partner not be given the details of your procedure findings, then the procedure report has been included in a sealed envelope for you to review at your convenience later.  YOU SHOULD EXPECT: Some feelings of bloating in the abdomen. Passage of more gas than usual.  Walking can help get rid of the air that was put into your GI tract during the procedure and reduce the bloating. If you had a lower endoscopy (such as a colonoscopy or flexible sigmoidoscopy) you may notice spotting of blood in your stool or on the toilet paper. If you underwent a bowel prep for your procedure, you may not have a normal bowel movement for a few days.  Please Note:  You might notice some irritation and congestion in your nose or some drainage.  This is from the oxygen used during your procedure.  There is no need for concern and it should clear up in a day or so.  SYMPTOMS TO REPORT IMMEDIATELY:  Following lower endoscopy (colonoscopy or flexible sigmoidoscopy):  Excessive amounts of blood in the stool  Significant tenderness or worsening of abdominal pains  Swelling of the abdomen that is new, acute  Fever of 100F or higher  For urgent or emergent issues, a gastroenterologist can be reached at any hour by calling [phone]. Do not use MyChart messaging for urgent concerns.    DIET:  We do recommend a small meal at first, but then you may proceed to your regular diet.  Drink plenty of fluids but you should avoid alcoholic beverages for  24 hours.  ACTIVITY:  You should plan to take it easy for the rest of today and you should NOT DRIVE or use heavy machinery until tomorrow (because of the sedation medicines used during the test).    FOLLOW UP: Our staff will call the number listed on your records the next business day following your procedure.  We will call around 7:15- 8:00 am to check on you and address any questions or concerns that you may have regarding the information given to you following your procedure. If we do not reach you, we will leave a message.     If any biopsies were taken you will be contacted by phone or by letter within the next 1-3 weeks.  Please call us at [phone] if you have not heard about the biopsies in 3 weeks.    SIGNATURES/CONFIDENTIALITY: You and/or your care partner have signed paperwork which will be entered into your electronic medical record.  These signatures attest to the fact that that the information above on your After Visit Summary has been reviewed and is understood.  Full responsibility of the confidentiality of this discharge information lies with you and/or your care-partner.  

## 2023-05-04 NOTE — Progress Notes (Signed)
GASTROENTEROLOGY PROCEDURE H&P NOTE   Primary Care Physician: Creola Corn, MD    Reason for Procedure:  History of colon polyp  Plan:    Colonoscopy  Patient is appropriate for endoscopic procedure(s) in the ambulatory ([facility]) setting.  The nature of the procedure, as well as the risks, benefits, and alternatives were carefully and thoroughly reviewed with the patient. Ample time for discussion and questions allowed. The patient understood, was satisfied, and agreed to proceed.     HPI: Daniel [name] is a 62 y.o. male who presents for surveillance colonoscopy.  Medical history as below.  Tolerated the prep.  No recent chest pain or shortness of breath.  No abdominal pain today.  Past Medical History:  Diagnosis Date   Ankylosing spondylitis (HCC)    of whole spine   Anxiety    Arthritis    oa in hands   Avascular necrosis of bone of hip, left (HCC) 04/26/2017   Coronary atherosclerosis    lad coronary artery calcification per chest ct 06-26-2021 epic   Cubital tunnel syndrome on left 08/2014   Cyst of left kidney    per mr abdomen 07-30-2021 epic   Dental bridge present    lower left   Dental crown present    lower right   Family history of adverse reaction to anesthesia    hx of ponv   GERD (gastroesophageal reflux disease)    Head trauma 2012   no residual deficit from   Hiatal hernia    Hyperlipidemia    Insomnia    no meds taken   PONV (postoperative nausea and vomiting)    Pulmonary nodule    Sebaceous cyst    back   Tubular adenoma of colon    Ulnar artery thrombus, 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   Wears glasses    for reading    Past Surgical History:  Procedure Laterality Date   ARTERY REPAIR Right 08/30/2014   Procedure: RIGHT ULNAR ARTERY RESECTION WITH VEIN GRAFT;  Surgeon: Dairl Ponder, MD;  Location: [facility];  Service: Orthopedics;  Laterality: Right;   CERVICAL SPINE SURGERY  11/14/2020   C 4 to C 6 rods and steel plate in  place   CYST EXCISION N/A 09/18/2021   Procedure: EXCISION OF BACK SEBACEOUS CYSTS;  Surgeon: Quentin Ore, MD;  Location: [facility];  Service: General;  Laterality: N/A;   ELBOW ARTHROSCOPY WITH TENDON RECONSTRUCTION Left 07/01/2011   ECRL and brevis   HERNIA REPAIR  1998   TONSILLECTOMY     as child   TOTAL HIP ARTHROPLASTY Left 04/26/2017   TOTAL HIP ARTHROPLASTY Left 04/26/2017   Procedure: LEFT TOTAL HIP ARTHROPLASTY;  Surgeon: Teryl Lucy, MD;  Location: [facility];  Service: Orthopedics;  Laterality: Left;   ULNAR TUNNEL RELEASE Right 08/30/2014   Procedure: RIGHT CUBITAL TUNNEL RELEASE;  Surgeon: Dairl Ponder, MD;  Location: [facility];  Service: Orthopedics;  Laterality: Right;   UMBILICAL HERNIA REPAIR     WRIST ARTHROSCOPY WITH ULNA SHORTENING Right 12/28/2007    Prior to Admission medications   Medication Sig Start Date End Date Taking? Authorizing Provider  aspirin EC 81 MG tablet Take 81 mg by mouth at bedtime.   Yes [provider]  atorvastatin (LIPITOR) 10 MG tablet Take 10 mg by mouth daily.   Yes [provider]  citalopram (CELEXA) 40 MG tablet Take 40 mg by mouth at bedtime.    Yes [provider]  pantoprazole (PROTONIX) 20 MG tablet Take 20 mg by mouth daily.   Yes [provider]  Multiple Vitamins-Minerals (MULTIVITAMIN WITH MINERALS) tablet Take 1 tablet by mouth daily. centrum Patient not taking: Reported on 04/07/2023    [provider]    Current Outpatient Medications  Medication Sig Dispense Refill   aspirin EC 81 MG tablet Take 81 mg by mouth at bedtime.     atorvastatin (LIPITOR) 10 MG tablet Take 10 mg by mouth daily.     citalopram (CELEXA) 40 MG tablet Take 40 mg by mouth at bedtime.      pantoprazole (PROTONIX) 20 MG tablet Take 20 mg by mouth daily.     Multiple Vitamins-Minerals (MULTIVITAMIN WITH MINERALS) tablet Take 1 tablet by mouth daily. centrum (Patient  not taking: Reported on 04/07/2023)     Current Facility-Administered Medications  Medication Dose Route Frequency Provider Last Rate Last Admin   0.9 %  sodium chloride infusion  500 mL Intravenous Once Eswin Worrell, Carie Caddy, MD        Allergies as of 05/04/2023 - Review Complete 05/04/2023  Allergen Reaction Noted   Vicodin [hydrocodone-acetaminophen] Itching 09/02/2012    Family History  Problem Relation Age of Onset   Osteoarthritis Mother    Hypertension Mother    Fibromyalgia Mother    Kidney disease Mother    Anxiety disorder Mother    CAD Father    Heart attack Father 1   Heart failure Father    Hypertension Father    Ovarian cancer Sister    Colon cancer Neg Hx    Esophageal cancer Neg Hx    Rectal cancer Neg Hx    Stomach cancer Neg Hx     Social History   Socioeconomic History   Marital status: Married    Spouse name: Not on file   Number of children: Not on file   Years of education: Not on file   Highest education level: Not on file  Occupational History   Not on file  Tobacco Use   Smoking status: Former   Smokeless tobacco: Never  Vaping Use   Vaping Use: Never used  Substance and Sexual Activity   Alcohol use: Yes    Comment: occasionally beer   Drug use: Not Currently   Sexual activity: Yes  Other Topics Concern   Not on file  Social History Narrative   Not on file   Social Determinants of Health   Financial Resource Strain: Not on file  Food Insecurity: Not on file  Transportation Needs: Not on file  Physical Activity: Not on file  Stress: Not on file  Social Connections: Not on file  Intimate Partner Violence: Not on file    Physical Exam: Vital signs in last 24 hours: @BP  (!) 131/94   Pulse 80   Temp 98.6 F (37 C)   Resp 13   Ht 6\' 2"  (1.88 m)   Wt 185 lb (83.9 kg)   SpO2 96%   BMI 23.75 kg/m  GEN: NAD EYE: Sclerae anicteric ENT: MMM CV: Non-tachycardic Pulm: CTA b/l GI: Soft, NT/ND NEURO:  Alert & Oriented x 3   Erick Blinks, MD [facility]  05/04/2023 2:18 PM

## 2023-05-04 NOTE — Progress Notes (Signed)
IV Zofran given during procedure, per patient's request. Uneventful anesthetic. Report to pacu rn. Vss. Care resumed by rn.

## 2023-05-04 NOTE — Progress Notes (Signed)
Pt's states no medical or surgical changes since previsit or office visit. 

## 2023-05-06 ENCOUNTER — Telehealth: Payer: Self-pay | Admitting: *Deleted

## 2023-05-06 NOTE — Telephone Encounter (Signed)
  Follow up Call-     05/04/2023    2:08 PM  Call back number  Post procedure Call Back phone  # [phone]  Permission to leave phone message Yes     Patient questions:  Do you have a fever, pain , or abdominal swelling? No. Pain Score  0 *  Have you tolerated food without any problems? Yes.    Have you been able to return to your normal activities? Yes.    Do you have any questions about your discharge instructions: Diet   No. Medications  No. Follow up visit  No.  Do you have questions or concerns about your Care? No.  Actions: * If pain score is 4 or above: No action needed, pain <4.
